# Patient Record
Sex: Male | Born: 1965 | Race: White | Hispanic: No | Marital: Married | Smoking: Never smoker
Health system: Southern US, Community
[De-identification: ages and names within clinical notes are randomized; demographics above are authoritative.]

## PROBLEM LIST (undated history)

## (undated) DIAGNOSIS — R0981 Nasal congestion: Secondary | ICD-10-CM

---

## 2003-08-21 ENCOUNTER — Emergency Department (HOSPITAL_COMMUNITY): Admission: EM | Admit: 2003-08-21 | Discharge: 2003-08-21 | Payer: Self-pay | Admitting: Emergency Medicine

## 2013-11-30 ENCOUNTER — Encounter (HOSPITAL_COMMUNITY): Payer: PRIVATE HEALTH INSURANCE | Admitting: Anesthesiology

## 2013-11-30 ENCOUNTER — Encounter (HOSPITAL_COMMUNITY): Admission: EM | Disposition: A | Discharge status: Home / Self Care | Payer: Self-pay | Source: Home / Self Care

## 2013-11-30 ENCOUNTER — Inpatient Hospital Stay (HOSPITAL_COMMUNITY)
Admission: EM | Admit: 2013-11-30 | Discharge: 2013-12-03 | DRG: 339 | Disposition: A | Discharge status: Home / Self Care | Payer: PRIVATE HEALTH INSURANCE | Attending: Surgery | Admitting: Surgery

## 2013-11-30 ENCOUNTER — Emergency Department (HOSPITAL_COMMUNITY): Payer: PRIVATE HEALTH INSURANCE | Admitting: Anesthesiology

## 2013-11-30 ENCOUNTER — Emergency Department (HOSPITAL_COMMUNITY): Payer: PRIVATE HEALTH INSURANCE

## 2013-11-30 ENCOUNTER — Encounter (HOSPITAL_COMMUNITY): Payer: Self-pay | Admitting: Emergency Medicine

## 2013-11-30 DIAGNOSIS — Y836 Removal of other organ (partial) (total) as the cause of abnormal reaction of the patient, or of later complication, without mention of misadventure at the time of the procedure: Secondary | ICD-10-CM | POA: Diagnosis not present

## 2013-11-30 DIAGNOSIS — K3532 Acute appendicitis with perforation and localized peritonitis, without abscess: Secondary | ICD-10-CM | POA: Diagnosis present

## 2013-11-30 DIAGNOSIS — K560 Paralytic ileus: Secondary | ICD-10-CM | POA: Diagnosis not present

## 2013-11-30 DIAGNOSIS — F17200 Nicotine dependence, unspecified, uncomplicated: Secondary | ICD-10-CM | POA: Diagnosis present

## 2013-11-30 DIAGNOSIS — K37 Unspecified appendicitis: Secondary | ICD-10-CM

## 2013-11-30 DIAGNOSIS — K3533 Acute appendicitis with perforation and localized peritonitis, with abscess: Principal | ICD-10-CM | POA: Diagnosis present

## 2013-11-30 DIAGNOSIS — K929 Disease of digestive system, unspecified: Secondary | ICD-10-CM | POA: Diagnosis not present

## 2013-11-30 DIAGNOSIS — Y9210 Unspecified residential institution as the place of occurrence of the external cause: Secondary | ICD-10-CM | POA: Diagnosis not present

## 2013-11-30 DIAGNOSIS — R319 Hematuria, unspecified: Secondary | ICD-10-CM | POA: Diagnosis not present

## 2013-11-30 DIAGNOSIS — K3580 Unspecified acute appendicitis: Secondary | ICD-10-CM

## 2013-11-30 HISTORY — PX: LAPAROSCOPIC APPENDECTOMY: SHX408

## 2013-11-30 HISTORY — PX: APPENDECTOMY: SHX54

## 2013-11-30 HISTORY — DX: Nasal congestion: R09.81

## 2013-11-30 LAB — CBC WITH DIFFERENTIAL/PLATELET
Basophils Absolute: 0 10*3/uL (ref 0.0–0.1)
Basophils Relative: 0 % (ref 0–1)
Eosinophils Absolute: 0 10*3/uL (ref 0.0–0.7)
Eosinophils Relative: 0 % (ref 0–5)
HCT: 43.5 % (ref 39.0–52.0)
Hemoglobin: 15.5 g/dL (ref 13.0–17.0)
Lymphocytes Relative: 7 % — ABNORMAL LOW (ref 12–46)
Lymphs Abs: 1.1 10*3/uL (ref 0.7–4.0)
MCH: 33.5 pg (ref 26.0–34.0)
MCHC: 35.6 g/dL (ref 30.0–36.0)
MCV: 94 fL (ref 78.0–100.0)
Monocytes Absolute: 1.4 10*3/uL — ABNORMAL HIGH (ref 0.1–1.0)
Monocytes Relative: 9 % (ref 3–12)
Neutro Abs: 13.2 10*3/uL — ABNORMAL HIGH (ref 1.7–7.7)
Neutrophils Relative %: 84 % — ABNORMAL HIGH (ref 43–77)
Platelets: 184 10*3/uL (ref 150–400)
RBC: 4.63 MIL/uL (ref 4.22–5.81)
RDW: 13.1 % (ref 11.5–15.5)
WBC: 15.6 10*3/uL — ABNORMAL HIGH (ref 4.0–10.5)

## 2013-11-30 LAB — COMPREHENSIVE METABOLIC PANEL
ALT: 94 U/L — ABNORMAL HIGH (ref 0–53)
AST: 65 U/L — ABNORMAL HIGH (ref 0–37)
Albumin: 3.8 g/dL (ref 3.5–5.2)
Alkaline Phosphatase: 74 U/L (ref 39–117)
BUN: 13 mg/dL (ref 6–23)
CO2: 29 mEq/L (ref 19–32)
Calcium: 10 mg/dL (ref 8.4–10.5)
Chloride: 94 mEq/L — ABNORMAL LOW (ref 96–112)
Creatinine, Ser: 1.34 mg/dL (ref 0.50–1.35)
GFR calc Af Amer: 73 mL/min — ABNORMAL LOW (ref >90)
GFR calc non Af Amer: 63 mL/min — ABNORMAL LOW (ref >90)
Glucose, Bld: 119 mg/dL — ABNORMAL HIGH (ref 70–99)
Potassium: 4.5 mEq/L (ref 3.7–5.3)
Sodium: 136 mEq/L — ABNORMAL LOW (ref 137–147)
Total Bilirubin: 3.1 mg/dL — ABNORMAL HIGH (ref 0.3–1.2)
Total Protein: 7.6 g/dL (ref 6.0–8.3)

## 2013-11-30 LAB — URINALYSIS, ROUTINE W REFLEX MICROSCOPIC
Glucose, UA: NEGATIVE mg/dL
Hgb urine dipstick: NEGATIVE
Ketones, ur: 15 mg/dL — AB
Nitrite: POSITIVE — AB
Protein, ur: 100 mg/dL — AB
Specific Gravity, Urine: 1.031 — ABNORMAL HIGH (ref 1.005–1.030)
Urobilinogen, UA: 1 mg/dL (ref 0.0–1.0)
pH: 5.5 (ref 5.0–8.0)

## 2013-11-30 LAB — URINE MICROSCOPIC-ADD ON

## 2013-11-30 SURGERY — APPENDECTOMY, LAPAROSCOPIC
Anesthesia: General | Site: Abdomen

## 2013-11-30 MED ORDER — SODIUM CHLORIDE 0.9 % IV SOLN
1.0000 g | INTRAVENOUS | Status: DC
  Administered 2013-11-30: 1 g via INTRAVENOUS
  Filled 2013-11-30: qty 1

## 2013-11-30 MED ORDER — IOHEXOL 300 MG/ML  SOLN
25.0000 mL | Freq: Once | INTRAMUSCULAR | Status: AC | PRN
  Administered 2013-11-30: 25 mL via ORAL

## 2013-11-30 MED ORDER — KETOROLAC TROMETHAMINE 30 MG/ML IJ SOLN
INTRAMUSCULAR | Status: DC | PRN
  Administered 2013-11-30: 30 mg via INTRAVENOUS

## 2013-11-30 MED ORDER — MIDAZOLAM HCL 2 MG/2ML IJ SOLN
INTRAMUSCULAR | Status: AC
  Filled 2013-11-30: qty 2

## 2013-11-30 MED ORDER — PHENYLEPHRINE HCL 10 MG/ML IJ SOLN
INTRAMUSCULAR | Status: DC | PRN
  Administered 2013-11-30: 80 ug via INTRAVENOUS

## 2013-11-30 MED ORDER — KETOROLAC TROMETHAMINE 30 MG/ML IJ SOLN
INTRAMUSCULAR | Status: AC
  Filled 2013-11-30: qty 1

## 2013-11-30 MED ORDER — IBUPROFEN 600 MG PO TABS
600.0000 mg | ORAL_TABLET | Freq: Four times a day (QID) | ORAL | Status: DC | PRN
  Administered 2013-12-02: 600 mg via ORAL
  Filled 2013-11-30: qty 1

## 2013-11-30 MED ORDER — ONDANSETRON HCL 4 MG/2ML IJ SOLN
INTRAMUSCULAR | Status: DC | PRN
  Administered 2013-11-30: 4 mg via INTRAVENOUS

## 2013-11-30 MED ORDER — LIDOCAINE HCL (CARDIAC) 20 MG/ML IV SOLN
INTRAVENOUS | Status: DC | PRN
  Administered 2013-11-30: 80 mg via INTRAVENOUS

## 2013-11-30 MED ORDER — LACTATED RINGERS IV SOLN
INTRAVENOUS | Status: DC | PRN
  Administered 2013-11-30 (×2): via INTRAVENOUS

## 2013-11-30 MED ORDER — ONDANSETRON HCL 4 MG/2ML IJ SOLN
4.0000 mg | Freq: Four times a day (QID) | INTRAMUSCULAR | Status: DC | PRN
  Administered 2013-12-02 (×2): 4 mg via INTRAVENOUS
  Filled 2013-11-30 (×2): qty 2

## 2013-11-30 MED ORDER — PROPOFOL 10 MG/ML IV BOLUS
INTRAVENOUS | Status: AC
  Filled 2013-11-30: qty 20

## 2013-11-30 MED ORDER — ENOXAPARIN SODIUM 40 MG/0.4ML ~~LOC~~ SOLN
40.0000 mg | SUBCUTANEOUS | Status: DC
  Administered 2013-12-01: 40 mg via SUBCUTANEOUS
  Filled 2013-11-30 (×4): qty 0.4

## 2013-11-30 MED ORDER — MIDAZOLAM HCL 2 MG/2ML IJ SOLN
INTRAMUSCULAR | Status: DC | PRN
  Administered 2013-11-30: 2 mg via INTRAVENOUS

## 2013-11-30 MED ORDER — ONDANSETRON HCL 4 MG PO TABS: 4.0000 mg | ORAL_TABLET | Freq: Four times a day (QID) | ORAL | Status: DC | PRN

## 2013-11-30 MED ORDER — HYDROMORPHONE HCL PF 1 MG/ML IJ SOLN
1.0000 mg | INTRAMUSCULAR | Status: DC | PRN
  Administered 2013-12-01: 1 mg via INTRAVENOUS
  Filled 2013-11-30: qty 1

## 2013-11-30 MED ORDER — METOCLOPRAMIDE HCL 5 MG/ML IJ SOLN: 10.0000 mg | Freq: Once | INTRAMUSCULAR | Status: DC | PRN

## 2013-11-30 MED ORDER — SODIUM CHLORIDE 0.9 % IR SOLN
Status: DC | PRN
  Administered 2013-11-30: 1000 mL

## 2013-11-30 MED ORDER — PROPOFOL 10 MG/ML IV BOLUS
INTRAVENOUS | Status: DC | PRN
  Administered 2013-11-30: 200 mg via INTRAVENOUS

## 2013-11-30 MED ORDER — ACETAMINOPHEN 325 MG PO TABS
650.0000 mg | ORAL_TABLET | Freq: Once | ORAL | Status: AC
  Administered 2013-11-30: 650 mg via ORAL
  Filled 2013-11-30: qty 2

## 2013-11-30 MED ORDER — HYDROMORPHONE HCL PF 1 MG/ML IJ SOLN: 0.2500 mg | INTRAMUSCULAR | Status: DC | PRN

## 2013-11-30 MED ORDER — MORPHINE SULFATE 4 MG/ML IJ SOLN
4.0000 mg | Freq: Once | INTRAMUSCULAR | Status: AC
  Administered 2013-11-30: 4 mg via INTRAVENOUS
  Filled 2013-11-30: qty 1

## 2013-11-30 MED ORDER — 0.9 % SODIUM CHLORIDE (POUR BTL) OPTIME
TOPICAL | Status: DC | PRN
  Administered 2013-11-30: 1000 mL

## 2013-11-30 MED ORDER — BUPIVACAINE-EPINEPHRINE (PF) 0.5% -1:200000 IJ SOLN
INTRAMUSCULAR | Status: AC
  Filled 2013-11-30: qty 10

## 2013-11-30 MED ORDER — IOHEXOL 300 MG/ML  SOLN
100.0000 mL | Freq: Once | INTRAMUSCULAR | Status: AC | PRN
  Administered 2013-11-30: 100 mL via INTRAVENOUS

## 2013-11-30 MED ORDER — OXYCODONE-ACETAMINOPHEN 5-325 MG PO TABS
1.0000 | ORAL_TABLET | ORAL | Status: DC | PRN
  Administered 2013-12-01 – 2013-12-03 (×4): 2 via ORAL
  Filled 2013-11-30 (×4): qty 2

## 2013-11-30 MED ORDER — PIPERACILLIN-TAZOBACTAM 3.375 G IVPB
3.3750 g | Freq: Three times a day (TID) | INTRAVENOUS | Status: DC
  Administered 2013-12-01 – 2013-12-03 (×7): 3.375 g via INTRAVENOUS
  Filled 2013-11-30 (×8): qty 50

## 2013-11-30 MED ORDER — SODIUM CHLORIDE 0.9 % IV BOLUS (SEPSIS)
1000.0000 mL | Freq: Once | INTRAVENOUS | Status: AC
  Administered 2013-11-30: 1000 mL via INTRAVENOUS

## 2013-11-30 MED ORDER — FENTANYL CITRATE 0.05 MG/ML IJ SOLN
INTRAMUSCULAR | Status: DC | PRN
  Administered 2013-11-30 (×2): 50 ug via INTRAVENOUS

## 2013-11-30 MED ORDER — OXYCODONE HCL 5 MG PO TABS: 5.0000 mg | ORAL_TABLET | Freq: Once | ORAL | Status: DC | PRN

## 2013-11-30 MED ORDER — FENTANYL CITRATE 0.05 MG/ML IJ SOLN
INTRAMUSCULAR | Status: AC
  Filled 2013-11-30: qty 5

## 2013-11-30 MED ORDER — ONDANSETRON HCL 4 MG/2ML IJ SOLN
4.0000 mg | Freq: Once | INTRAMUSCULAR | Status: AC
  Administered 2013-11-30: 4 mg via INTRAVENOUS
  Filled 2013-11-30: qty 2

## 2013-11-30 MED ORDER — POTASSIUM CHLORIDE IN NACL 20-0.9 MEQ/L-% IV SOLN
INTRAVENOUS | Status: DC
  Administered 2013-12-01 – 2013-12-03 (×3): via INTRAVENOUS
  Filled 2013-11-30 (×5): qty 1000

## 2013-11-30 MED ORDER — BUPIVACAINE-EPINEPHRINE 0.5% -1:200000 IJ SOLN
INTRAMUSCULAR | Status: DC | PRN
  Administered 2013-11-30: 20 mL

## 2013-11-30 MED ORDER — OXYCODONE HCL 5 MG/5ML PO SOLN: 5.0000 mg | Freq: Once | ORAL | Status: DC | PRN

## 2013-11-30 MED ORDER — SUCCINYLCHOLINE CHLORIDE 20 MG/ML IJ SOLN
INTRAMUSCULAR | Status: DC | PRN
  Administered 2013-11-30: 100 mg via INTRAVENOUS

## 2013-11-30 SURGICAL SUPPLY — 50 items
APPLIER CLIP LOGIC TI 5 (MISCELLANEOUS) IMPLANT
APPLIER CLIP ROT 10 11.4 M/L (STAPLE)
BANDAGE ADHESIVE 1X3 (GAUZE/BANDAGES/DRESSINGS) ×3 IMPLANT
BENZOIN TINCTURE PRP APPL 2/3 (GAUZE/BANDAGES/DRESSINGS) ×3 IMPLANT
CANISTER SUCTION 2500CC (MISCELLANEOUS) ×3 IMPLANT
CHLORAPREP W/TINT 26ML (MISCELLANEOUS) ×3 IMPLANT
CLIP APPLIE ROT 10 11.4 M/L (STAPLE) IMPLANT
CLOSURE WOUND 1/2 X4 (GAUZE/BANDAGES/DRESSINGS) ×1
COVER SURGICAL LIGHT HANDLE (MISCELLANEOUS) ×3 IMPLANT
CUTTER LINEAR ENDO 35 ETS (STAPLE) ×3 IMPLANT
CUTTER LINEAR ENDO 35 ETS TH (STAPLE) IMPLANT
DECANTER SPIKE VIAL GLASS SM (MISCELLANEOUS) ×3 IMPLANT
DRAIN CHANNEL 19F RND (DRAIN) ×3 IMPLANT
DRAPE UTILITY 15X26 W/TAPE STR (DRAPE) ×6 IMPLANT
ELECT REM PT RETURN 9FT ADLT (ELECTROSURGICAL) ×3
ELECTRODE REM PT RTRN 9FT ADLT (ELECTROSURGICAL) ×1 IMPLANT
ENDOLOOP SUT PDS II  0 18 (SUTURE)
ENDOLOOP SUT PDS II 0 18 (SUTURE) IMPLANT
EVACUATOR SILICONE 100CC (DRAIN) ×3 IMPLANT
GAUZE SPONGE 2X2 8PLY STRL LF (GAUZE/BANDAGES/DRESSINGS) ×2 IMPLANT
GLOVE BIOGEL PI IND STRL 6.5 (GLOVE) ×2 IMPLANT
GLOVE BIOGEL PI INDICATOR 6.5 (GLOVE) ×4
GLOVE ECLIPSE 6.5 STRL STRAW (GLOVE) ×3 IMPLANT
GLOVE SURG SIGNA 7.5 PF LTX (GLOVE) ×3 IMPLANT
GOWN STRL REUS W/ TWL LRG LVL3 (GOWN DISPOSABLE) ×1 IMPLANT
GOWN STRL REUS W/ TWL XL LVL3 (GOWN DISPOSABLE) ×1 IMPLANT
GOWN STRL REUS W/TWL LRG LVL3 (GOWN DISPOSABLE) ×2
GOWN STRL REUS W/TWL XL LVL3 (GOWN DISPOSABLE) ×2
KIT BASIN OR (CUSTOM PROCEDURE TRAY) ×3 IMPLANT
KIT ROOM TURNOVER OR (KITS) ×3 IMPLANT
NS IRRIG 1000ML POUR BTL (IV SOLUTION) ×3 IMPLANT
PAD ARMBOARD 7.5X6 YLW CONV (MISCELLANEOUS) ×6 IMPLANT
POUCH SPECIMEN RETRIEVAL 10MM (ENDOMECHANICALS) ×3 IMPLANT
RELOAD /EVU35 (ENDOMECHANICALS) IMPLANT
RELOAD CUTTER ETS 35MM STAND (ENDOMECHANICALS) IMPLANT
SCALPEL HARMONIC ACE (MISCELLANEOUS) ×3 IMPLANT
SET IRRIG TUBING LAPAROSCOPIC (IRRIGATION / IRRIGATOR) ×3 IMPLANT
SLEEVE ENDOPATH XCEL 5M (ENDOMECHANICALS) ×3 IMPLANT
SPECIMEN JAR SMALL (MISCELLANEOUS) ×3 IMPLANT
SPONGE GAUZE 2X2 STER 10/PKG (GAUZE/BANDAGES/DRESSINGS) ×4
STRIP CLOSURE SKIN 1/2X4 (GAUZE/BANDAGES/DRESSINGS) ×2 IMPLANT
SUT MON AB 4-0 PC3 18 (SUTURE) ×3 IMPLANT
SUT SILK 2 0 FS (SUTURE) ×3 IMPLANT
TAPE CLOTH SOFT 2X10 (GAUZE/BANDAGES/DRESSINGS) ×3 IMPLANT
TOWEL OR 17X24 6PK STRL BLUE (TOWEL DISPOSABLE) ×3 IMPLANT
TOWEL OR 17X26 10 PK STRL BLUE (TOWEL DISPOSABLE) ×3 IMPLANT
TRAY LAPAROSCOPIC (CUSTOM PROCEDURE TRAY) ×3 IMPLANT
TROCAR XCEL BLUNT TIP 100MML (ENDOMECHANICALS) ×3 IMPLANT
TROCAR XCEL NON-BLD 5MMX100MML (ENDOMECHANICALS) ×3 IMPLANT
WATER STERILE IRR 1000ML POUR (IV SOLUTION) IMPLANT

## 2013-11-30 NOTE — ED Provider Notes (Signed)
CSN: [id number]     Arrival date & time 11/30/13  1424 History   First MD Initiated Contact with Patient 11/30/13 1847     Chief Complaint  Patient presents with  . Abdominal Pain     (Consider location/radiation/quality/duration/timing/severity/associated sxs/prior Treatment) The history is provided by the patient. No language interpreter was used.  Zachary Lamb is a 48 y/o M with no known past medical history presenting to the ED with abdominal pain that started Sunday. Patient reported that the pain is localized to the RLQ and is described as a sharp pain - stated that over the course of the past 2 days reported that the pain is localized to the lower portion of his abdomen described as a constant sharp pain with mild radiation to the right lower portion of the back. Patient reported that he has been feeling nauseous and has been having numerous episodes of emesis. Reported that he has not been able to have a bowel movement or passing of gas within the past couple of days. Wife reported that patient was seen and assessed by his PCP yesterday. Denied chest pain, difficulty breathing, numbness, tingling, dizziness, weakness. PCP [facility]  History reviewed. No pertinent past medical history. History reviewed. No pertinent past surgical history. No family history on file. History  Substance Use Topics  . Smoking status: Current Every Day Smoker  . Smokeless tobacco: Not on file  . Alcohol Use: No    Review of Systems  Constitutional: Positive for fever. Negative for chills.  Respiratory: Negative for chest tightness and shortness of breath.   Cardiovascular: Negative for chest pain.  Gastrointestinal: Positive for nausea, vomiting and abdominal pain. Negative for constipation, blood in stool and anal bleeding.  Genitourinary: Positive for hematuria and flank pain. Negative for decreased urine volume.  Musculoskeletal: Positive for back pain.  Neurological: Negative for dizziness and  weakness.  All other systems reviewed and are negative.      Allergies  Review of patient's allergies indicates no known allergies.  Home Medications  No current outpatient prescriptions on file. BP 108/63  Pulse 125  Temp(Src) 102.9 F (39.4 C) (Oral)  Resp 13  Wt 200 lb (90.719 kg)  SpO2 94% Physical Exam  Nursing note and vitals reviewed. Constitutional: He is oriented to person, place, and time. He appears well-developed and well-nourished. No distress.  HENT:  Head: Normocephalic and atraumatic.  Mouth/Throat: Oropharynx is clear and moist. No oropharyngeal exudate.  Eyes: Conjunctivae and EOM are normal. Pupils are equal, round, and reactive to light. Right eye exhibits no discharge. Left eye exhibits no discharge.  Neck: Normal range of motion. Neck supple. No tracheal deviation present.  Cardiovascular: Regular rhythm and normal heart sounds.  Tachycardia present.  Exam reveals no friction rub.   No murmur heard. Pulmonary/Chest: Effort normal and breath sounds normal. No respiratory distress. He has no wheezes. He has no rales.  Abdominal: Soft. Normal appearance and bowel sounds are normal. There is tenderness in the right lower quadrant, suprapubic area and left lower quadrant. There is guarding and tenderness at McBurney's point. There is no rigidity and negative Murphy's sign.    Discomfort upon palpation to the RLQ, LLQ, and suprapubic region Positive McBurney's point  Musculoskeletal: Normal range of motion.  Lymphadenopathy:    He has no cervical adenopathy.  Neurological: He is alert and oriented to person, place, and time. No cranial nerve deficit. He exhibits normal muscle tone. Coordination normal.  Skin: Skin is warm. He is diaphoretic.  Psychiatric: He has a normal mood and affect. His behavior is normal. Thought content normal.    ED Course  Procedures (including critical care time)  8:34 PM This provider spoke with Dr. Magnus Ivan, general surgery -  discussed case, history, presentation, labs, and imaging. General surgery to see.   Results for orders placed during the hospital encounter of 11/30/13  CBC WITH DIFFERENTIAL      Result Value Ref Range   WBC 15.6 (*) 4.0 - 10.5 K/uL   RBC 4.63  4.22 - 5.81 MIL/uL   Hemoglobin 15.5  13.0 - 17.0 g/dL   HCT 16.1  09.6 - 04.5 %   MCV 94.0  78.0 - 100.0 fL   MCH 33.5  26.0 - 34.0 pg   MCHC 35.6  30.0 - 36.0 g/dL   RDW 40.9  81.1 - 91.4 %   Platelets 184  150 - 400 K/uL   Neutrophils Relative % 84 (*) 43 - 77 %   Neutro Abs 13.2 (*) 1.7 - 7.7 K/uL   Lymphocytes Relative 7 (*) 12 - 46 %   Lymphs Abs 1.1  0.7 - 4.0 K/uL   Monocytes Relative 9  3 - 12 %   Monocytes Absolute 1.4 (*) 0.1 - 1.0 K/uL   Eosinophils Relative 0  0 - 5 %   Eosinophils Absolute 0.0  0.0 - 0.7 K/uL   Basophils Relative 0  0 - 1 %   Basophils Absolute 0.0  0.0 - 0.1 K/uL  COMPREHENSIVE METABOLIC PANEL      Result Value Ref Range   Sodium 136 (*) 137 - 147 mEq/L   Potassium 4.5  3.7 - 5.3 mEq/L   Chloride 94 (*) 96 - 112 mEq/L   CO2 29  19 - 32 mEq/L   Glucose, Bld 119 (*) 70 - 99 mg/dL   BUN 13  6 - 23 mg/dL   Creatinine, Ser 7.82  0.50 - 1.35 mg/dL   Calcium 95.6  8.4 - 21.3 mg/dL   Total Protein 7.6  6.0 - 8.3 g/dL   Albumin 3.8  3.5 - 5.2 g/dL   AST 65 (*) 0 - 37 U/L   ALT 94 (*) 0 - 53 U/L   Alkaline Phosphatase 74  39 - 117 U/L   Total Bilirubin 3.1 (*) 0.3 - 1.2 mg/dL   GFR calc non Af Amer 63 (*) >90 mL/min   GFR calc Af Amer 73 (*) >90 mL/min  URINALYSIS, ROUTINE W REFLEX MICROSCOPIC      Result Value Ref Range   Color, Urine ORANGE (*) YELLOW   APPearance CLOUDY (*) CLEAR   Specific Gravity, Urine 1.031 (*) 1.005 - 1.030   pH 5.5  5.0 - 8.0   Glucose, UA NEGATIVE  NEGATIVE mg/dL   Hgb urine dipstick NEGATIVE  NEGATIVE   Bilirubin Urine MODERATE (*) NEGATIVE   Ketones, ur 15 (*) NEGATIVE mg/dL   Protein, ur 086 (*) NEGATIVE mg/dL   Urobilinogen, UA 1.0  0.0 - 1.0 mg/dL   Nitrite POSITIVE  (*) NEGATIVE   Leukocytes, UA SMALL (*) NEGATIVE  URINE MICROSCOPIC-ADD ON      Result Value Ref Range   Squamous Epithelial / LPF FEW (*) RARE   WBC, UA 3-6  <3 WBC/hpf   RBC / HPF 0-2  <3 RBC/hpf   Bacteria, UA FEW (*) RARE   Casts HYALINE CASTS (*) NEGATIVE   Urine-Other MUCOUS PRESENT      Labs Review Labs Reviewed  CBC  WITH DIFFERENTIAL - Abnormal; Notable for the following:    WBC 15.6 (*)    Neutrophils Relative % 84 (*)    Neutro Abs 13.2 (*)    Lymphocytes Relative 7 (*)    Monocytes Absolute 1.4 (*)    All other components within normal limits  COMPREHENSIVE METABOLIC PANEL - Abnormal; Notable for the following:    Sodium 136 (*)    Chloride 94 (*)    Glucose, Bld 119 (*)    AST 65 (*)    ALT 94 (*)    Total Bilirubin 3.1 (*)    GFR calc non Af Amer 63 (*)    GFR calc Af Amer 73 (*)    All other components within normal limits  URINALYSIS, ROUTINE W REFLEX MICROSCOPIC - Abnormal; Notable for the following:    Color, Urine ORANGE (*)    APPearance CLOUDY (*)    Specific Gravity, Urine 1.031 (*)    Bilirubin Urine MODERATE (*)    Ketones, ur 15 (*)    Protein, ur 100 (*)    Nitrite POSITIVE (*)    Leukocytes, UA SMALL (*)    All other components within normal limits  URINE MICROSCOPIC-ADD ON - Abnormal; Notable for the following:    Squamous Epithelial / LPF FEW (*)    Bacteria, UA FEW (*)    Casts HYALINE CASTS (*)    All other components within normal limits   Imaging Review Ct Abdomen Pelvis W Contrast  11/30/2013   CLINICAL DATA:  Severe right lower quadrant pain. nausea and vomiting. Fever.  EXAM: CT ABDOMEN AND PELVIS WITH CONTRAST  TECHNIQUE: Multidetector CT imaging of the abdomen and pelvis was performed using the standard protocol following bolus administration of intravenous contrast.  CONTRAST:  OMNIPAQUE IOHEXOL 300 MG/ML  SOLN  COMPARISON:  None.  FINDINGS: The appendix is dilated measuring approximately 1.5 cm, with a large appendicolith  near the base and mild to moderate periappendiceal inflammatory changes. These findings are consistent with acute appendicitis. There is no evidence of abscess. No evidence of bowel obstruction or other complication.  The liver, gallbladder, pancreas, spleen, adrenal glands, and kidneys are normal in appearance. No evidence of hydronephrosis. No soft tissue masses or lymphadenopathy identified. No other significant abnormality identified.  IMPRESSION: Positive for acute appendicitis.  No evidence of abscess or other complication.  These results were called by telephone at the time of interpretation on 11/30/2013 at 7:52 PM to Dr. Vanetta Mulders, who verbally acknowledged these results.   Electronically Signed   By: Myles Rosenthal M.D.   On: 11/30/2013 19:57     EKG Interpretation None      MDM   Final diagnoses:  Appendicitis   Medications  sodium chloride 0.9 % bolus 1,000 mL (1,000 mLs Intravenous New Bag/Given 11/30/13 1913)  acetaminophen (TYLENOL) tablet 650 mg (650 mg Oral Given 11/30/13 1911)  iohexol (OMNIPAQUE) 300 MG/ML solution 25 mL (25 mLs Oral Contrast Given 11/30/13 1917)  iohexol (OMNIPAQUE) 300 MG/ML solution 100 mL (100 mLs Intravenous Contrast Given 11/30/13 1934)  morphine 4 MG/ML injection 4 mg (4 mg Intravenous Given 11/30/13 2019)  ondansetron (ZOFRAN) injection 4 mg (4 mg Intravenous Given 11/30/13 2019)   Filed Vitals:   11/30/13 1830 11/30/13 1845 11/30/13 1900 11/30/13 1915  BP: 111/67 109/66 118/70 108/63  Pulse: 130 127 129 125  Temp:      TempSrc:      Resp: 18 21 25 13   Weight:  SpO2: 96% 95% 95% 94%   Patient presenting to the ED with abdominal pain that started on Sunday localized to the RLQ that has now spread to the lower abdominal region bilaterally described as a constant sharp pain. Associated symptoms of nausea and vomiting. Reported that he has been feeling feverish over the past couple of days. Reported that he was seen and assessed by his PCP  yesterday.  Alert and oriented. GCS 15. Heart rate and rhythm normal. Lungs clear to auscultation to upper and lower lobes bilaterally. Radial pulses 2+ bilaterally. BS normoactive in all 4 quadrants with discomfort upon palpation to the RLQ, LLQ, suprapubic with positive guarding. Positive McBurney's point.  CBC noted elevation white blood cell count 15.6 with elevated neutrophils of 13.2--negative left shift identified. CMP noted elevated AST 65 ALT of 94. Urinalysis noted small leukocytes and positive nitrites-with positive urinary tract infection identified. CT abdomen and pelvis identified acute appendicitis-no evidence of abscess or other complication.  This provider spoke with general surgery - general surgeon see and assess patient. Discussed with patient he'll most likely be going into surgery for approval of the appendix. Patient not febrile upon arrival to the ED with a temperature 102F-Tylenol minister in ED setting. Patient tachycardic. Blood pressure controlled while in ED setting. IV fluids administered. Patient to be admitted to the hospital for appendicitis.   Raymon MuttonMarissa Dontavius Keim, PA-C 11/30/13 2347

## 2013-11-30 NOTE — Transfer of Care (Signed)
Immediate Anesthesia Transfer of Care Note  Patient: Zachary Lamb  Procedure(s) Performed: Procedure(s): APPENDECTOMY LAPAROSCOPIC (N/A)  Patient Location: PACU  Anesthesia Type:General  Level of Consciousness: awake, sedated and patient cooperative  Airway & Oxygen Therapy: Patient Spontanous Breathing and Patient connected to nasal cannula oxygen  Post-op Assessment: Report given to PACU RN, Post -op Vital signs reviewed and stable and Patient moving all extremities X 4  Post vital signs: Reviewed  Complications: No apparent anesthesia complications

## 2013-11-30 NOTE — Anesthesia Preprocedure Evaluation (Signed)
Anesthesia Evaluation  Patient identified by MRN, date of birth, ID band Patient awake    Reviewed: Allergy & Precautions, H&P , NPO status , Patient's Chart, lab work & pertinent test results, reviewed documented beta blocker date and time   Airway Mallampati: II TM Distance: >3 FB Neck ROM: full    Dental   Pulmonary Current Smoker,  breath sounds clear to auscultation        Cardiovascular negative cardio ROS  Rhythm:regular     Neuro/Psych negative neurological ROS  negative psych ROS   GI/Hepatic negative GI ROS, Neg liver ROS,   Endo/Other  negative endocrine ROS  Renal/GU negative Renal ROS  negative genitourinary   Musculoskeletal   Abdominal   Peds  Hematology negative hematology ROS (+)   Anesthesia Other Findings See surgeon's H&P   Reproductive/Obstetrics negative OB ROS                           Anesthesia Physical Anesthesia Plan  ASA: II and emergent  Anesthesia Plan: General   Post-op Pain Management:    Induction: Intravenous, Rapid sequence and Cricoid pressure planned  Airway Management Planned: Oral ETT  Additional Equipment:   Intra-op Plan:   Post-operative Plan: Extubation in OR  Informed Consent: I have reviewed the patients History and Physical, chart, labs and discussed the procedure including the risks, benefits and alternatives for the proposed anesthesia with the patient or authorized representative who has indicated his/her understanding and acceptance.   Dental Advisory Given  Plan Discussed with: CRNA and Surgeon  Anesthesia Plan Comments:         Anesthesia Quick Evaluation  

## 2013-11-30 NOTE — Op Note (Signed)
Appendectomy, Lap, Procedure Note  Indications: The patient presented with a history of right-sided abdominal pain. A CT revealed findings consistent with acute appendicitis.  Pre-operative Diagnosis: Acute appendicitis with generalized peritonitis  Post-operative Diagnosis: Acute appendicitis with peritoneal abscess  Surgeon: Abigail MiyamotoBLACKMAN,Labib Cwynar A   Assistants: 0  Anesthesia: General endotracheal anesthesia  ASA Class: 1  Procedure Details  The patient was seen again in the Holding Room. The risks, benefits, complications, treatment options, and expected outcomes were discussed with the patient and/or family. The possibilities of reaction to medication, perforation of viscus, bleeding, recurrent infection, finding a normal appendix, the need for additional procedures, failure to diagnose a condition, and creating a complication requiring transfusion or operation were discussed. There was concurrence with the proposed plan and informed consent was obtained. The site of surgery was properly noted. The patient was taken to Operating Room, identified as Zachary Lamb and the procedure verified as Appendectomy. A Time Out was held and the above information confirmed.  The patient was placed in the supine position and general anesthesia was induced, along with placement of orogastric tube, Venodyne boots, and a Foley catheter. The abdomen was prepped and draped in a sterile fashion. A one centimeter infraumbilical incision was made.  The umbilical stalk was elevated, and the midline fascia was incised with a #11 blade.  A Kelly clamp was used to confirm entrance into the peritoneal cavity.  A pursestring suture was passed around the incision with a 0 Vicryl.  The Hasson was introduced into the abdomen and the tails of the suture were used to hold the Hasson in place.   The pneumoperitoneum was then established to steady pressure of 15 mmHg.  Additional 5 mm cannulas then placed in the left lower  quadrant of the abdomen and the suprapubic region under direct visualization. A careful evaluation of the entire abdomen was carried out. The patient was placed in Trendelenburg and left lateral decubitus position. The small intestines were retracted in the cephalad and left lateral direction away from the pelvis and right lower quadrant. The patient was found to have an enlarged and inflamed appendix that was extending into the pelvis. There was purulent fluid, fibrinous exudate, and a periappendiceal abcess.  The appendix was carefully dissected. The appendix was was skeletonized with the harmonic scalpel.   The appendix was divided at its base using an endo-GIA stapler. Minimal appendiceal stump was left in place. There was no evidence of bleeding, leakage, or complication after division of the appendix. Irrigation was also performed and irrigate suctioned from the abdomen as well.  A small incision was made in the RLQ and a 19 FR blake drain was placed in the right pericolic gutter.  It was sutured in place with a 2.0 silk suture.  All ports were then removed under direct vision.  The umbilical port site was closed with the purse string suture. There was no residual palpable fascial defect.  The trocar site skin wounds were closed with 4-0 Monocryl.  Instrument, sponge, and needle counts were correct at the conclusion of the case.   Findings: The appendix was found to be inflamed. There were signs of necrosis.  There was perforation. There was abscess formation.  Estimated Blood Loss:  Minimal         Drains:19 fr blake         Complications:  None; patient tolerated the procedure well.         Disposition: PACU - hemodynamically stable.  Condition: stable

## 2013-11-30 NOTE — ED Notes (Signed)
Marissa PA and Dr. Deretha EmoryZackowski at bedside

## 2013-11-30 NOTE — Anesthesia Procedure Notes (Signed)
Procedure Name: Intubation Date/Time: 11/30/2013 9:42 PM Performed by: Molli HazardGORDON, Micharl Helmes M Pre-anesthesia Checklist: Patient identified, Emergency Drugs available, Suction available, Patient being monitored and Timeout performed Patient Re-evaluated:Patient Re-evaluated prior to inductionOxygen Delivery Method: Circle system utilized Preoxygenation: Pre-oxygenation with 100% oxygen Intubation Type: IV induction and Rapid sequence Laryngoscope Size: Miller and 2 Grade View: Grade I Tube type: Oral Tube size: 7.5 mm Number of attempts: 1 Airway Equipment and Method: Stylet Placement Confirmation: ETT inserted through vocal cords under direct vision,  positive ETCO2 and breath sounds checked- equal and bilateral Secured at: 23 cm Tube secured with: Tape Dental Injury: Teeth and Oropharynx as per pre-operative assessment

## 2013-11-30 NOTE — ED Provider Notes (Signed)
Medical screening examination/treatment/procedure(s) were conducted as a shared visit with non-physician practitioner(s) and myself.  I personally evaluated the patient during the encounter.   EKG Interpretation None       Results for orders placed during the hospital encounter of 11/30/13  CBC WITH DIFFERENTIAL      Result Value Ref Range   WBC 15.6 (*) 4.0 - 10.5 K/uL   RBC 4.63  4.22 - 5.81 MIL/uL   Hemoglobin 15.5  13.0 - 17.0 g/dL   HCT 04.543.5  40.939.0 - 81.152.0 %   MCV 94.0  78.0 - 100.0 fL   MCH 33.5  26.0 - 34.0 pg   MCHC 35.6  30.0 - 36.0 g/dL   RDW 91.413.1  78.211.5 - 95.615.5 %   Platelets 184  150 - 400 K/uL   Neutrophils Relative % 84 (*) 43 - 77 %   Neutro Abs 13.2 (*) 1.7 - 7.7 K/uL   Lymphocytes Relative 7 (*) 12 - 46 %   Lymphs Abs 1.1  0.7 - 4.0 K/uL   Monocytes Relative 9  3 - 12 %   Monocytes Absolute 1.4 (*) 0.1 - 1.0 K/uL   Eosinophils Relative 0  0 - 5 %   Eosinophils Absolute 0.0  0.0 - 0.7 K/uL   Basophils Relative 0  0 - 1 %   Basophils Absolute 0.0  0.0 - 0.1 K/uL  COMPREHENSIVE METABOLIC PANEL      Result Value Ref Range   Sodium 136 (*) 137 - 147 mEq/L   Potassium 4.5  3.7 - 5.3 mEq/L   Chloride 94 (*) 96 - 112 mEq/L   CO2 29  19 - 32 mEq/L   Glucose, Bld 119 (*) 70 - 99 mg/dL   BUN 13  6 - 23 mg/dL   Creatinine, Ser 2.131.34  0.50 - 1.35 mg/dL   Calcium 08.610.0  8.4 - 57.810.5 mg/dL   Total Protein 7.6  6.0 - 8.3 g/dL   Albumin 3.8  3.5 - 5.2 g/dL   AST 65 (*) 0 - 37 U/L   ALT 94 (*) 0 - 53 U/L   Alkaline Phosphatase 74  39 - 117 U/L   Total Bilirubin 3.1 (*) 0.3 - 1.2 mg/dL   GFR calc non Af Amer 63 (*) >90 mL/min   GFR calc Af Amer 73 (*) >90 mL/min  URINALYSIS, ROUTINE W REFLEX MICROSCOPIC      Result Value Ref Range   Color, Urine ORANGE (*) YELLOW   APPearance CLOUDY (*) CLEAR   Specific Gravity, Urine 1.031 (*) 1.005 - 1.030   pH 5.5  5.0 - 8.0   Glucose, UA NEGATIVE  NEGATIVE mg/dL   Hgb urine dipstick NEGATIVE  NEGATIVE   Bilirubin Urine MODERATE (*)  NEGATIVE   Ketones, ur 15 (*) NEGATIVE mg/dL   Protein, ur 469100 (*) NEGATIVE mg/dL   Urobilinogen, UA 1.0  0.0 - 1.0 mg/dL   Nitrite POSITIVE (*) NEGATIVE   Leukocytes, UA SMALL (*) NEGATIVE  URINE MICROSCOPIC-ADD ON      Result Value Ref Range   Squamous Epithelial / LPF FEW (*) RARE   WBC, UA 3-6  <3 WBC/hpf   RBC / HPF 0-2  <3 RBC/hpf   Bacteria, UA FEW (*) RARE   Casts HYALINE CASTS (*) NEGATIVE   Urine-Other MUCOUS PRESENT     Ct Abdomen Pelvis W Contrast  11/30/2013   CLINICAL DATA:  Severe right lower quadrant pain. nausea and vomiting. Fever.  EXAM: CT ABDOMEN  AND PELVIS WITH CONTRAST  TECHNIQUE: Multidetector CT imaging of the abdomen and pelvis was performed using the standard protocol following bolus administration of intravenous contrast.  CONTRAST:  OMNIPAQUE IOHEXOL 300 MG/ML  SOLN  COMPARISON:  None.  FINDINGS: The appendix is dilated measuring approximately 1.5 cm, with a large appendicolith near the base and mild to moderate periappendiceal inflammatory changes. These findings are consistent with acute appendicitis. There is no evidence of abscess. No evidence of bowel obstruction or other complication.  The liver, gallbladder, pancreas, spleen, adrenal glands, and kidneys are normal in appearance. No evidence of hydronephrosis. No soft tissue masses or lymphadenopathy identified. No other significant abnormality identified.  IMPRESSION: Positive for acute appendicitis.  No evidence of abscess or other complication.  These results were called by telephone at the time of interpretation on 11/30/2013 at 7:52 PM to Dr. Vanetta Mulders, who verbally acknowledged these results.   Electronically Signed   By: Myles Rosenthal M.D.   On: 11/30/2013 19:57    The patient seen by me. Patient with onset of symptoms on Sunday evening right lower corner pain and tenderness CAT scan consistent with acute appendicitis. General surgery contacted. Patient last had something to eat around 3:00 in the  afternoon he had a couple sips of water to get some Tylenol down recently.  Shelda Jakes, MD 11/30/13 2016

## 2013-11-30 NOTE — ED Notes (Signed)
Pt started having vomiting and lower right cramping and then saw MD and was told to call if worse.  Pt states pain is spreading across abdomen.  Pt reports not able to have BM for 2 days and cannot pass gas.  Pt states went to urinate and had blood in his urine.  Pt reports pain to lower back.

## 2013-11-30 NOTE — ED Notes (Signed)
Pt finished drinking contrast. CT notified. 

## 2013-11-30 NOTE — H&P (Signed)
Zachary Lamb is an 48 y.o. male.   Chief Complaint: right lower quadrant abdominal pain HPI: this gentleman presents with a two-day history of right lower quadrant abdominal pain. It started vaguely on Sunday. He has had nausea and vomiting. The pain is now sharp and constant located in the right lower quadrant. It is moderate to severe in nature. It does not refer anywhere else. He is otherwise without complaints  History reviewed. No pertinent past medical history.  History reviewed. No pertinent past surgical history.  No family history on file. Social History:  reports that he has been smoking.  He does not have any smokeless tobacco history on file. He reports that he does not drink alcohol or use illicit drugs.  Allergies: No Known Allergies   (Not in a hospital admission)  Results for orders placed during the hospital encounter of 11/30/13 (from the past 48 hour(s))  CBC WITH DIFFERENTIAL     Status: Abnormal   Collection Time    11/30/13  2:40 PM      Result Value Ref Range   WBC 15.6 (*) 4.0 - 10.5 K/uL   RBC 4.63  4.22 - 5.81 MIL/uL   Hemoglobin 15.5  13.0 - 17.0 g/dL   HCT 43.5  39.0 - 52.0 %   MCV 94.0  78.0 - 100.0 fL   MCH 33.5  26.0 - 34.0 pg   MCHC 35.6  30.0 - 36.0 g/dL   RDW 13.1  11.5 - 15.5 %   Platelets 184  150 - 400 K/uL   Neutrophils Relative % 84 (*) 43 - 77 %   Neutro Abs 13.2 (*) 1.7 - 7.7 K/uL   Lymphocytes Relative 7 (*) 12 - 46 %   Lymphs Abs 1.1  0.7 - 4.0 K/uL   Monocytes Relative 9  3 - 12 %   Monocytes Absolute 1.4 (*) 0.1 - 1.0 K/uL   Eosinophils Relative 0  0 - 5 %   Eosinophils Absolute 0.0  0.0 - 0.7 K/uL   Basophils Relative 0  0 - 1 %   Basophils Absolute 0.0  0.0 - 0.1 K/uL  COMPREHENSIVE METABOLIC PANEL     Status: Abnormal   Collection Time    11/30/13  2:40 PM      Result Value Ref Range   Sodium 136 (*) 137 - 147 mEq/L   Potassium 4.5  3.7 - 5.3 mEq/L   Chloride 94 (*) 96 - 112 mEq/L   CO2 29  19 - 32 mEq/L   Glucose, Bld  119 (*) 70 - 99 mg/dL   BUN 13  6 - 23 mg/dL   Creatinine, Ser 1.34  0.50 - 1.35 mg/dL   Calcium 10.0  8.4 - 10.5 mg/dL   Total Protein 7.6  6.0 - 8.3 g/dL   Albumin 3.8  3.5 - 5.2 g/dL   AST 65 (*) 0 - 37 U/L   ALT 94 (*) 0 - 53 U/L   Alkaline Phosphatase 74  39 - 117 U/L   Total Bilirubin 3.1 (*) 0.3 - 1.2 mg/dL   GFR calc non Af Amer 63 (*) >90 mL/min   GFR calc Af Amer 73 (*) >90 mL/min   Comment: (NOTE)     The eGFR has been calculated using the CKD EPI equation.     This calculation has not been validated in all clinical situations.     eGFR's persistently <90 mL/min signify possible Chronic Kidney     Disease.  URINALYSIS,  ROUTINE W REFLEX MICROSCOPIC     Status: Abnormal   Collection Time    11/30/13  6:47 PM      Result Value Ref Range   Color, Urine ORANGE (*) YELLOW   Comment: BIOCHEMICALS MAY BE AFFECTED BY COLOR   APPearance CLOUDY (*) CLEAR   Specific Gravity, Urine 1.031 (*) 1.005 - 1.030   pH 5.5  5.0 - 8.0   Glucose, UA NEGATIVE  NEGATIVE mg/dL   Hgb urine dipstick NEGATIVE  NEGATIVE   Bilirubin Urine MODERATE (*) NEGATIVE   Ketones, ur 15 (*) NEGATIVE mg/dL   Protein, ur 100 (*) NEGATIVE mg/dL   Urobilinogen, UA 1.0  0.0 - 1.0 mg/dL   Nitrite POSITIVE (*) NEGATIVE   Leukocytes, UA SMALL (*) NEGATIVE  URINE MICROSCOPIC-ADD ON     Status: Abnormal   Collection Time    11/30/13  6:47 PM      Result Value Ref Range   Squamous Epithelial / LPF FEW (*) RARE   WBC, UA 3-6  <3 WBC/hpf   RBC / HPF 0-2  <3 RBC/hpf   Bacteria, UA FEW (*) RARE   Casts HYALINE CASTS (*) NEGATIVE   Urine-Other MUCOUS PRESENT     Comment: AMORPHOUS URATES/PHOSPHATES   Ct Abdomen Pelvis W Contrast  11/30/2013   CLINICAL DATA:  Severe right lower quadrant pain. nausea and vomiting. Fever.  EXAM: CT ABDOMEN AND PELVIS WITH CONTRAST  TECHNIQUE: Multidetector CT imaging of the abdomen and pelvis was performed using the standard protocol following bolus administration of intravenous  contrast.  CONTRAST:  170m OMNIPAQUE IOHEXOL 300 MG/ML  SOLN  COMPARISON:  None.  FINDINGS: The appendix is dilated measuring approximately 1.5 cm, with a large appendicolith near the base and mild to moderate periappendiceal inflammatory changes. These findings are consistent with acute appendicitis. There is no evidence of abscess. No evidence of bowel obstruction or other complication.  The liver, gallbladder, pancreas, spleen, adrenal glands, and kidneys are normal in appearance. No evidence of hydronephrosis. No soft tissue masses or lymphadenopathy identified. No other significant abnormality identified.  IMPRESSION: Positive for acute appendicitis.  No evidence of abscess or other complication.  These results were called by telephone at the time of interpretation on 11/30/2013 at 7:52 PM to Dr. SFredia Sorrow who verbally acknowledged these results.   Electronically Signed   By: JEarle GellM.D.   On: 11/30/2013 19:57    Review of Systems  All other systems reviewed and are negative.    Blood pressure 108/63, pulse 125, temperature 102.9 F (39.4 C), temperature source Oral, resp. rate 13, weight 200 lb (90.719 kg), SpO2 94.00%. Physical Exam  Constitutional: He is oriented to person, place, and time. He appears well-developed and well-nourished. He appears distressed.  HENT:  Head: Normocephalic and atraumatic.  Right Ear: External ear normal.  Left Ear: External ear normal.  Nose: Nose normal.  Mouth/Throat: No oropharyngeal exudate.  Eyes: Conjunctivae are normal. Pupils are equal, round, and reactive to light. Right eye exhibits no discharge. Left eye exhibits no discharge. No scleral icterus.  Neck: Normal range of motion. Neck supple. No tracheal deviation present.  Cardiovascular: Regular rhythm and intact distal pulses.   Murmur heard. tachycardic  Respiratory: Effort normal and breath sounds normal. No respiratory distress. He has no wheezes.  GI: Soft. There is tenderness.  There is guarding.  There is moderate to severe tenderness with guarding in the right lower quadrant  Musculoskeletal: Normal range of motion. He exhibits no  edema and no tenderness.  Lymphadenopathy:    He has no cervical adenopathy.  Neurological: He is alert and oriented to person, place, and time.  Skin: Skin is warm and dry. No rash noted. No erythema.  Psychiatric: His behavior is normal. Judgment normal.     Assessment/Plan Acute appendicitis  I discussed this with the patient and his family. I recommend going ahead and proceeding with appendectomy. I discussed the laparoscopic approach.  I discussed the risk of surgery which includes but is not limited to bleeding, infection, injury to surrounding structures, need for further surgery, need to convert to an open procedure, et Ronney Asters. I also discussed post operative recovery. Surgery will be scheduled. Preoperative antibiotics will be given  Wyatte Dames A 11/30/2013, 8:51 PM

## 2013-12-01 ENCOUNTER — Encounter (HOSPITAL_COMMUNITY): Payer: Self-pay | Admitting: *Deleted

## 2013-12-01 LAB — BASIC METABOLIC PANEL
BUN: 13 mg/dL (ref 6–23)
CO2: 25 mEq/L (ref 19–32)
CREATININE: 1.09 mg/dL (ref 0.50–1.35)
Calcium: 8.9 mg/dL (ref 8.4–10.5)
Chloride: 99 mEq/L (ref 96–112)
GFR calc Af Amer: >90 mL/min (ref >90)
GFR, EST NON AFRICAN AMERICAN: 80 mL/min — AB (ref >90)
GLUCOSE: 100 mg/dL — AB (ref 70–99)
Potassium: 5 mEq/L (ref 3.7–5.3)
Sodium: 137 mEq/L (ref 137–147)

## 2013-12-01 LAB — CBC
HCT: 37.4 % — ABNORMAL LOW (ref 39.0–52.0)
HEMOGLOBIN: 13.2 g/dL (ref 13.0–17.0)
MCH: 33.4 pg (ref 26.0–34.0)
MCHC: 35.3 g/dL (ref 30.0–36.0)
MCV: 94.7 fL (ref 78.0–100.0)
Platelets: 169 10*3/uL (ref 150–400)
RBC: 3.95 MIL/uL — ABNORMAL LOW (ref 4.22–5.81)
RDW: 13.1 % (ref 11.5–15.5)
WBC: 8.9 10*3/uL (ref 4.0–10.5)

## 2013-12-01 MED ORDER — BISACODYL 10 MG RE SUPP
10.0000 mg | Freq: Once | RECTAL | Status: AC
  Administered 2013-12-01: 10 mg via RECTAL
  Filled 2013-12-01: qty 1

## 2013-12-01 MED ORDER — DOCUSATE SODIUM 100 MG PO CAPS
100.0000 mg | ORAL_CAPSULE | Freq: Two times a day (BID) | ORAL | Status: DC | PRN
  Administered 2013-12-01 – 2013-12-02 (×3): 100 mg via ORAL
  Filled 2013-12-01 (×3): qty 1

## 2013-12-01 NOTE — Progress Notes (Signed)
UR completed.  Lowell Makara, RN BSN [facility] CCM Trauma/Neuro ICU Case Manager [phone]  

## 2013-12-01 NOTE — Progress Notes (Signed)
1 Day Post-Op  Subjective: Pt feeling quite sore.  RLQ pain improved.  No N/V.  Tolerating clears, hungry.  Afebrile.  Ambulating in the room.  No IS in room yet - ordered one.  Objective: Vital signs in last 24 hours: Temp:  [97.7 F (36.5 C)-102.9 F (39.4 C)] 98.6 F (37 C) (03/18 0500) Pulse Rate:  [94-130] 94 (03/18 0500) Resp:  [9-25] 17 (03/18 0500) BP: (102-137)/(62-77) 102/62 mmHg (03/18 0500) SpO2:  [94 %-100 %] 99 % (03/18 0500) Weight:  [200 lb (90.719 kg)-211 lb 1.6 oz (95.754 kg)] 211 lb 1.6 oz (95.754 kg) (03/17 2356) Last BM Date: 11/28/13  Intake/Output from previous day: 03/17 0701 - 03/18 0700 In: 1500 [I.V.:1500] Out: 610 [Urine:550; Drains:60] Intake/Output this shift:    PE: Gen:  Alert, NAD, pleasant Card:  RRR, no M/G/R heard Pulm:  CTA, no W/R/R Abd: Soft, appropriately tender, ND, +BS, no HSM, incisions C/D/I, drain with serosanguinous drainage (74mL/24 hours)   Lab Results:   Recent Labs  11/30/13 1440 12/01/13 0406  WBC 15.6* 8.9  HGB 15.5 13.2  HCT 43.5 37.4*  PLT 184 169   BMET  Recent Labs  11/30/13 1440 12/01/13 0406  NA 136* 137  K 4.5 5.0  CL 94* 99  CO2 29 25  GLUCOSE 119* 100*  BUN 13 13  CREATININE 1.34 1.09  CALCIUM 10.0 8.9   PT/INR No results found for this basename: LABPROT, INR,  in the last 72 hours CMP     Component Value Date/Time   NA 137 12/01/2013 0406   K 5.0 12/01/2013 0406   CL 99 12/01/2013 0406   CO2 25 12/01/2013 0406   GLUCOSE 100* 12/01/2013 0406   BUN 13 12/01/2013 0406   CREATININE 1.09 12/01/2013 0406   CALCIUM 8.9 12/01/2013 0406   PROT 7.6 11/30/2013 1440   ALBUMIN 3.8 11/30/2013 1440   AST 65* 11/30/2013 1440   ALT 94* 11/30/2013 1440   ALKPHOS 74 11/30/2013 1440   BILITOT 3.1* 11/30/2013 1440   GFRNONAA 80* 12/01/2013 0406   GFRAA >90 12/01/2013 0406   Lipase  No results found for this basename: lipase       Studies/Results: Ct Abdomen Pelvis W Contrast  11/30/2013   CLINICAL DATA:   Severe right lower quadrant pain. nausea and vomiting. Fever.  EXAM: CT ABDOMEN AND PELVIS WITH CONTRAST  TECHNIQUE: Multidetector CT imaging of the abdomen and pelvis was performed using the standard protocol following bolus administration of intravenous contrast.  CONTRAST:  OMNIPAQUE IOHEXOL 300 MG/ML  SOLN  COMPARISON:  None.  FINDINGS: The appendix is dilated measuring approximately 1.5 cm, with a large appendicolith near the base and mild to moderate periappendiceal inflammatory changes. These findings are consistent with acute appendicitis. There is no evidence of abscess. No evidence of bowel obstruction or other complication.  The liver, gallbladder, pancreas, spleen, adrenal glands, and kidneys are normal in appearance. No evidence of hydronephrosis. No soft tissue masses or lymphadenopathy identified. No other significant abnormality identified.  IMPRESSION: Positive for acute appendicitis.  No evidence of abscess or other complication.  These results were called by telephone at the time of interpretation on 11/30/2013 at 7:52 PM to Dr. Vanetta Mulders, who verbally acknowledged these results.   Electronically Signed   By: Myles Rosenthal M.D.   On: 11/30/2013 19:57    Anti-infectives: Anti-infectives   Start     Dose/Rate Route Frequency Ordered Stop   12/01/13 0000  piperacillin-tazobactam (ZOSYN) IVPB 3.375  g     3.375 g 12.5 mL/hr over 240 Minutes Intravenous Every 8 hours 11/30/13 2357     11/30/13 2100  ertapenem (INVANZ) 1 g in sodium chloride 0.9 % 50 mL IVPB  Status:  Discontinued     1 g 100 mL/hr over 30 Minutes Intravenous Every 24 hours 11/30/13 2050 11/30/13 2357       Assessment/Plan Acute appendicitis with peritoneal abscess, POD #1 s/p lap appy and JP drain placement  Plan: 1.  Regular diet 2.  Ambulate and IS 3.  SCD's and lovenox 4.  Continue zosyn for 7 days 5.  Will need to keep here in the hospital for IV antibiotics, pain control, and evaluation of drain,  home in a few days    LOS: 1 day    DORT, Shada Nienaber 12/01/2013, 8:29 AM Pager: [phone]

## 2013-12-01 NOTE — Progress Notes (Signed)
Doing ok will need 10 days of antibiotics.

## 2013-12-01 NOTE — Anesthesia Postprocedure Evaluation (Signed)
Anesthesia Post Note  Patient: Zachary Lamb  Procedure(s) Performed: Procedure(s) (LRB): APPENDECTOMY LAPAROSCOPIC (N/A)  Anesthesia type: General  Patient location: PACU  Post pain: Pain level controlled  Post assessment: Patient's Cardiovascular Status Stable  Last Vitals:  Filed Vitals:   12/01/13 1002  BP: 106/62  Pulse: 92  Temp: 36.8 C  Resp: 18    Post vital signs: Reviewed and stable  Level of consciousness: alert  Complications: No apparent anesthesia complications

## 2013-12-02 LAB — URINALYSIS, ROUTINE W REFLEX MICROSCOPIC
Glucose, UA: NEGATIVE mg/dL
Hgb urine dipstick: NEGATIVE
Ketones, ur: 15 mg/dL — AB
Nitrite: POSITIVE — AB
Protein, ur: 30 mg/dL — AB
Specific Gravity, Urine: 1.025 (ref 1.005–1.030)
UROBILINOGEN UA: 2 mg/dL — AB (ref 0.0–1.0)
pH: 6 (ref 5.0–8.0)

## 2013-12-02 LAB — URINE MICROSCOPIC-ADD ON

## 2013-12-02 MED ORDER — POLYETHYLENE GLYCOL 3350 17 G PO PACK
17.0000 g | PACK | Freq: Every day | ORAL | Status: DC
  Administered 2013-12-02: 17 g via ORAL
  Filled 2013-12-02 (×2): qty 1

## 2013-12-02 MED ORDER — BISACODYL 10 MG RE SUPP: 10.0000 mg | Freq: Every day | RECTAL | Status: DC | PRN

## 2013-12-02 MED ORDER — BISACODYL 10 MG RE SUPP
10.0000 mg | Freq: Once | RECTAL | Status: AC
  Administered 2013-12-02: 10 mg via RECTAL
  Filled 2013-12-02: qty 1

## 2013-12-02 NOTE — Progress Notes (Signed)
Patient ID: Zachary Lamb, male   DOB: 01/15/1966, 48 y.o.   MRN: [id number]  Subjective: No flatus or BM.  No n/v.  Ambulating.  Eating liquids mostly.   Objective:  Vital signs:  Filed Vitals:   12/01/13 1330 12/01/13 1721 12/01/13 2241 12/02/13 0524  BP: 115/57 105/66 115/66 114/65  Pulse: 97 96 96 102  Temp: 97.6 F (36.4 C) 98.3 F (36.8 C) 98.2 F (36.8 C) 98.7 F (37.1 C)  TempSrc: Oral Oral Oral   Resp: _0 Height:      Weight:      SpO2: 97% 98% 92% 94%    Last BM Date: 11/28/13  Intake/Output   Yesterday:  03/18 0701 - 03/19 0700 In: 1800 [P.O.:600; I.V.:1200] Out: 505 [Urine:350; Drains:155] This shift:    I/O last 3 completed shifts: In: 3300 [P.O.:600; I.V.:2700] Out: 51 [Urine:900; Drains:215]    Physical Exam: General: Pt awake/alert/oriented x3 in no  acute distress Chest: cta. No chest wall pain w good excursion CV:  Pulses intact.  Regular rhythm MS: Normal AROM mjr joints.  No obvious deformity Abdomen: Soft. distended. mildly tender at incisions only.  No evidence of peritonitis.  No incarcerated hernias. Ext:  SCDs BLE.  No mjr edema.  No cyanosis Skin: No petechiae / purpura   Problem List:   Active Problems:   Perforated appendicitis    Results:   Labs: Results for orders placed during the hospital encounter of 11/30/13 (from the past 48 hour(s))  CBC WITH DIFFERENTIAL     Status: Abnormal   Collection Time    11/30/13  2:40 PM      Result Value Ref Range   WBC 15.6 (*) 4.0 - 10.5 K/uL   RBC 4.63  4.22 - 5.81 MIL/uL   Hemoglobin 15.5  13.0 - 17.0 g/dL   HCT 43.5  39.0 - 52.0 %   MCV 94.0  78.0 - 100.0 fL   MCH 33.5  26.0 - 34.0 pg   MCHC 35.6  30.0 - 36.0 g/dL   RDW 13.1  11.5 - 15.5 %   Platelets 184  150 - 400 K/uL   Neutrophils Relative % 84 (*) 43 - 77 %   Neutro Abs 13.2 (*) 1.7 - 7.7 K/uL   Lymphocytes Relative 7 (*) 12 - 46 %   Lymphs Abs 1.1  0.7 - 4.0 K/uL   Monocytes Relative 9  3 - 12 %    Monocytes Absolute 1.4 (*) 0.1 - 1.0 K/uL   Eosinophils Relative 0  0 - 5 %   Eosinophils Absolute 0.0  0.0 - 0.7 K/uL   Basophils Relative 0  0 - 1 %   Basophils Absolute 0.0  0.0 - 0.1 K/uL  COMPREHENSIVE METABOLIC PANEL     Status: Abnormal   Collection Time    11/30/13  2:40 PM      Result Value Ref Range   Sodium 136 (*) 137 - 147 mEq/L   Potassium 4.5  3.7 - 5.3 mEq/L   Chloride 94 (*) 96 - 112 mEq/L   CO2 29  19 - 32 mEq/L   Glucose, Bld 119 (*) 70 - 99 mg/dL   BUN 13  6 - 23 mg/dL   Creatinine, Ser 1.34  0.50 - 1.35 mg/dL   Calcium 10.0  8.4 - 10.5 mg/dL   Total Protein 7.6  6.0 - 8.3 g/dL   Albumin 3.8  3.5 - 5.2 g/dL   AST 65 (*)  0 - 37 U/L   ALT 94 (*) 0 - 53 U/L   Alkaline Phosphatase 74  39 - 117 U/L   Total Bilirubin 3.1 (*) 0.3 - 1.2 mg/dL   GFR calc non Af Amer 63 (*) >90 mL/min   GFR calc Af Amer 73 (*) >90 mL/min   Comment: (NOTE)     The eGFR has been calculated using the CKD EPI equation.     This calculation has not been validated in all clinical situations.     eGFR's persistently <90 mL/min signify possible Chronic Kidney     Disease.  URINALYSIS, ROUTINE W REFLEX MICROSCOPIC     Status: Abnormal   Collection Time    11/30/13  6:47 PM      Result Value Ref Range   Color, Urine ORANGE (*) YELLOW   Comment: BIOCHEMICALS MAY BE AFFECTED BY COLOR   APPearance CLOUDY (*) CLEAR   Specific Gravity, Urine 1.031 (*) 1.005 - 1.030   pH 5.5  5.0 - 8.0   Glucose, UA NEGATIVE  NEGATIVE mg/dL   Hgb urine dipstick NEGATIVE  NEGATIVE   Bilirubin Urine MODERATE (*) NEGATIVE   Ketones, ur 15 (*) NEGATIVE mg/dL   Protein, ur 100 (*) NEGATIVE mg/dL   Urobilinogen, UA 1.0  0.0 - 1.0 mg/dL   Nitrite POSITIVE (*) NEGATIVE   Leukocytes, UA SMALL (*) NEGATIVE  URINE MICROSCOPIC-ADD ON     Status: Abnormal   Collection Time    11/30/13  6:47 PM      Result Value Ref Range   Squamous Epithelial / LPF FEW (*) RARE   WBC, UA 3-6  <3 WBC/hpf   RBC / HPF 0-2  <3 RBC/hpf    Bacteria, UA FEW (*) RARE   Casts HYALINE CASTS (*) NEGATIVE   Urine-Other MUCOUS PRESENT     Comment: AMORPHOUS URATES/PHOSPHATES  BASIC METABOLIC PANEL     Status: Abnormal   Collection Time    12/01/13  4:06 AM      Result Value Ref Range   Sodium 137  137 - 147 mEq/L   Potassium 5.0  3.7 - 5.3 mEq/L   Chloride 99  96 - 112 mEq/L   CO2 25  19 - 32 mEq/L   Glucose, Bld 100 (*) 70 - 99 mg/dL   BUN 13  6 - 23 mg/dL   Creatinine, Ser 1.09  0.50 - 1.35 mg/dL   Calcium 8.9  8.4 - 10.5 mg/dL   GFR calc non Af Amer 80 (*) >90 mL/min   GFR calc Af Amer >90  >90 mL/min   Comment: (NOTE)     The eGFR has been calculated using the CKD EPI equation.     This calculation has not been validated in all clinical situations.     eGFR's persistently <90 mL/min signify possible Chronic Kidney     Disease.  CBC     Status: Abnormal   Collection Time    12/01/13  4:06 AM      Result Value Ref Range   WBC 8.9  4.0 - 10.5 K/uL   RBC 3.95 (*) 4.22 - 5.81 MIL/uL   Hemoglobin 13.2  13.0 - 17.0 g/dL   HCT 37.4 (*) 39.0 - 52.0 %   MCV 94.7  78.0 - 100.0 fL   MCH 33.4  26.0 - 34.0 pg   MCHC 35.3  30.0 - 36.0 g/dL   RDW 13.1  11.5 - 15.5 %   Platelets 169  150 -  400 K/uL    Imaging / Studies: Ct Abdomen Pelvis W Contrast  11/30/2013   CLINICAL DATA:  Severe right lower quadrant pain. nausea and vomiting. Fever.  EXAM: CT ABDOMEN AND PELVIS WITH CONTRAST  TECHNIQUE: Multidetector CT imaging of the abdomen and pelvis was performed using the standard protocol following bolus administration of intravenous contrast.  CONTRAST:  159m OMNIPAQUE IOHEXOL 300 MG/ML  SOLN  COMPARISON:  None.  FINDINGS: The appendix is dilated measuring approximately 1.5 cm, with a large appendicolith near the base and mild to moderate periappendiceal inflammatory changes. These findings are consistent with acute appendicitis. There is no evidence of abscess. No evidence of bowel obstruction or other complication.  The liver,  gallbladder, pancreas, spleen, adrenal glands, and kidneys are normal in appearance. No evidence of hydronephrosis. No soft tissue masses or lymphadenopathy identified. No other significant abnormality identified.  IMPRESSION: Positive for acute appendicitis.  No evidence of abscess or other complication.  These results were called by telephone at the time of interpretation on 11/30/2013 at 7:52 PM to Dr. SFredia Sorrow who verbally acknowledged these results.   Electronically Signed   By: JEarle GellM.D.   On: 11/30/2013 19:57    Medications / Allergies: per chart  Antibiotics: Anti-infectives   Start     Dose/Rate Route Frequency Ordered Stop   12/01/13 0000  piperacillin-tazobactam (ZOSYN) IVPB 3.375 g     3.375 g 12.5 mL/hr over 240 Minutes Intravenous Every 8 hours 11/30/13 2357     11/30/13 2100  ertapenem (INVANZ) 1 g in sodium chloride 0.9 % 50 mL IVPB  Status:  Discontinued     1 g 100 mL/hr over 30 Minutes Intravenous Every 24 hours 11/30/13 2050 11/30/13 2357      Assessment/Plan Acute appendicitis with peritoneal abscess, POD #2 s/p lap appy and JP drain placement -may need to back off diet, will hold off for now -add miralax -SCDs, lovenox -IS -Ambulate -Continue with IV antibiotics, total of 7 days of therapy -Continue with drain  EErby Pian AConocoPhillipsSurgery Pager [phone] [phone] 12/02/2013 8:00 AM

## 2013-12-02 NOTE — Progress Notes (Signed)
Ileus not unexpected. Will recheck CBC. KEEP ON LIQUIDS.

## 2013-12-02 NOTE — Progress Notes (Signed)
Urine dark/pink tinged/clear ua sent to lab at this time.

## 2013-12-03 ENCOUNTER — Encounter (HOSPITAL_COMMUNITY): Payer: Self-pay | Admitting: Surgery

## 2013-12-03 LAB — URINE CULTURE
Colony Count: NO GROWTH
Culture: NO GROWTH

## 2013-12-03 LAB — CBC
HEMATOCRIT: 34 % — AB (ref 39.0–52.0)
Hemoglobin: 11.9 g/dL — ABNORMAL LOW (ref 13.0–17.0)
MCH: 33.5 pg (ref 26.0–34.0)
MCHC: 35 g/dL (ref 30.0–36.0)
MCV: 95.8 fL (ref 78.0–100.0)
Platelets: 207 10*3/uL (ref 150–400)
RBC: 3.55 MIL/uL — ABNORMAL LOW (ref 4.22–5.81)
RDW: 13.4 % (ref 11.5–15.5)
WBC: 8 10*3/uL (ref 4.0–10.5)

## 2013-12-03 MED ORDER — CIPROFLOXACIN HCL 500 MG PO TABS: 500.0000 mg | ORAL_TABLET | Freq: Two times a day (BID) | ORAL | Status: DC

## 2013-12-03 MED ORDER — CIPROFLOXACIN HCL 500 MG PO TABS
500.0000 mg | ORAL_TABLET | Freq: Two times a day (BID) | ORAL | Status: DC
  Administered 2013-12-03: 500 mg via ORAL
  Filled 2013-12-03: qty 1

## 2013-12-03 MED ORDER — DSS 100 MG PO CAPS: 100.0000 mg | ORAL_CAPSULE | Freq: Two times a day (BID) | ORAL | Status: DC | PRN

## 2013-12-03 MED ORDER — IBUPROFEN 600 MG PO TABS: 600.0000 mg | ORAL_TABLET | Freq: Four times a day (QID) | ORAL | Status: DC | PRN

## 2013-12-03 MED ORDER — OXYCODONE-ACETAMINOPHEN 5-325 MG PO TABS: 1.0000 | ORAL_TABLET | ORAL | Status: DC | PRN

## 2013-12-03 NOTE — Progress Notes (Signed)
Discharge instructions given and explained to pt. And wife.  Prescriptions given to pt. Pt. And wife verbalized understanding of all orders/instructions.  Pt. In stable condition to return home.  Awaiting ride from wife later in afternoon.   Vanice Sarahhompson, Gaspar Fowle L

## 2013-12-03 NOTE — Discharge Instructions (Signed)

## 2013-12-03 NOTE — Discharge Summary (Signed)
Physician Discharge Summary  Zachary MatesDaniel Lamb HYQ:[id number] DOB: 01/04/1966 DOA: 11/30/2013  PCP: No primary provider on file.  Consultation: none  Admit date: 11/30/2013 Discharge date: 12/03/2013  Recommendations for Outpatient Follow-up:   Follow-up Information   Follow up with [facility] On 12/21/2013. (arrive by 1:15PM for a 1:45PM appt for a post operative check up)    Contact information:   [address]   [place] [address] [phone]      Discharge Diagnoses:  Acute appendicitis with generalized peritonitis  Surgical Procedure: laparoscopic appendectomy with drain placement---Dr. Magnus IvanBlackman 11/30/13  Discharge Condition: stable Disposition: home  Diet recommendation: regular  Filed Weights   11/30/13 1431 11/30/13 2356  Weight: 200 lb (90.719 kg) 211 lb 1.6 oz (95.754 kg)     Filed Vitals:   12/03/13 0538  BP: 112/69  Pulse: 80  Temp: 98.3 F (36.8 C)  Resp: 16     Hospital Course:  [facility] Lamb [facility].  His work up showed acute appendicitis.  He underwent the procedure listed above.  He was found to have peritonitis.  He was therefore kept in the hospital for IV antibiotics.  He developed a post-op ileus which resolved.  His white count normalized.  He had hematuria, antiovitics were changed to cipro for better coverage and a culture is pending.  The hematuria has resolved on POD#3 this was likely related to the surrounding inflammation.  On POD## the patient was tolerating a diet, having BMs, VSS, afebrile, ambulating and therefore felt stable for discharge.  JP drain was removed, serous output.  We discussed warning signs that warrant immediate attention.  Medication risks, benefits and therapeutic alternatives were discussed.  A follow up appointment has been made on his behalf.     Physical Exam:  General: Pt awake/alert/oriented x3 in no acute distress  Chest: cta. No chest wall pain w good  excursion  CV: Pulses intact. Regular rhythm  MS: Normal AROM mjr joints. No obvious deformity  Abdomen: Soft. nondistended. mildly tender at incisions only. No evidence of peritonitis. No incarcerated hernias. JP serous output Ext: SCDs BLE. No mjr edema. No cyanosis  Skin: No petechiae / purpura  Discharge Instructions     Medication List         ciprofloxacin 500 MG tablet  Commonly known as:  CIPRO  Take 1 tablet (500 mg total) by mouth 2 (two) times daily.     DSS 100 MG Caps  Take 100 mg by mouth 2 (two) times daily as needed for mild constipation or moderate constipation.     ibuprofen 600 MG tablet  Commonly known as:  ADVIL,MOTRIN  Take 1 tablet (600 mg total) by mouth every 6 (six) hours as needed (mild pain).     oxyCODONE-acetaminophen 5-325 MG per tablet  Commonly known as:  PERCOCET/ROXICET  Take 1-2 tablets by mouth every 4 (four) hours as needed for moderate pain.           Follow-up Information   Follow up with [facility] On 12/21/2013. (arrive by 1:15PM for a 1:45PM appt for a post operative check up)    Contact information:   [address]   [place] [address] [phone]        The results of significant diagnostics from this hospitalization (including imaging, microbiology, ancillary and laboratory) are listed below for reference.    Significant Diagnostic Studies:  Ct Abdomen Pelvis W Contrast  11/30/2013   CLINICAL DATA:  Severe right lower quadrant pain. nausea and vomiting. Fever.  EXAM: CT ABDOMEN AND PELVIS WITH CONTRAST  TECHNIQUE: Multidetector CT imaging of the abdomen and pelvis was performed using the standard protocol following bolus administration of intravenous contrast.  CONTRAST:  OMNIPAQUE IOHEXOL 300 MG/ML  SOLN  COMPARISON:  None.  FINDINGS: The appendix is dilated measuring approximately 1.5 cm, with a large appendicolith near the base and mild to moderate periappendiceal inflammatory changes.  These findings are consistent with acute appendicitis. There is no evidence of abscess. No evidence of bowel obstruction or other complication.  The liver, gallbladder, pancreas, spleen, adrenal glands, and kidneys are normal in appearance. No evidence of hydronephrosis. No soft tissue masses or lymphadenopathy identified. No other significant abnormality identified.  IMPRESSION: Positive for acute appendicitis.  No evidence of abscess or other complication.  These results were called by telephone at the time of interpretation on 11/30/2013 at 7:52 PM to Dr. Vanetta Mulders, who verbally acknowledged these results.   Electronically Signed   By: Myles Rosenthal M.D.   On: 11/30/2013 19:57    Microbiology: No results found for this or any previous visit (from the past 240 hour(s)).   Labs: Basic Metabolic Panel:  Recent Labs Lab 11/30/13 1440 12/01/13 0406  NA 136* 137  K 4.5 5.0  CL 94* 99  CO2 29 25  GLUCOSE 119* 100*  BUN 13 13  CREATININE 1.34 1.09  CALCIUM 10.0 8.9   Liver Function Tests:  Recent Labs Lab 11/30/13 1440  AST 65*  ALT 94*  ALKPHOS 74  BILITOT 3.1*  PROT 7.6  ALBUMIN 3.8   No results found for this basename: LIPASE, AMYLASE,  in the last 168 hours No results found for this basename: AMMONIA,  in the last 168 hours CBC:  Recent Labs Lab 11/30/13 1440 12/01/13 0406  WBC 15.6* 8.9  NEUTROABS 13.2*  --   HGB 15.5 13.2  HCT 43.5 37.4*  MCV 94.0 94.7  PLT 184 169    Active Problems:   Perforated appendicitis   Signed:  Nasri Boakye, ANP-BC

## 2013-12-03 NOTE — Discharge Summary (Signed)
Feels better. Ok for d/c.

## 2013-12-03 NOTE — Progress Notes (Signed)
JP drain removed.  Pt. Tolerated well.  Oral pain meds given.  Will continue to monitor. Vanice Sarahhompson, Trana Ressler L

## 2013-12-09 ENCOUNTER — Telehealth (INDEPENDENT_AMBULATORY_CARE_PROVIDER_SITE_OTHER): Payer: Self-pay | Admitting: General Surgery

## 2013-12-09 NOTE — Telephone Encounter (Signed)
Pt called to ask for refill of Cipro based on history of ruptured appendix and subsequent surgery.  Pt admits there in no fever, new pain, no swelling, redness, exudate or odor.  Reassured pt the is no indication he needs additional antibiotics at this time, but he wants to ask MD for his opinion.

## 2013-12-10 NOTE — Telephone Encounter (Signed)
Unless he has fever, he does not need to be on any more antibiotics when current ones are finished

## 2013-12-28 ENCOUNTER — Encounter (INDEPENDENT_AMBULATORY_CARE_PROVIDER_SITE_OTHER): Payer: PRIVATE HEALTH INSURANCE

## 2013-12-30 ENCOUNTER — Encounter (INDEPENDENT_AMBULATORY_CARE_PROVIDER_SITE_OTHER): Payer: PRIVATE HEALTH INSURANCE | Admitting: Surgery

## 2014-01-11 ENCOUNTER — Encounter (INDEPENDENT_AMBULATORY_CARE_PROVIDER_SITE_OTHER): Payer: Self-pay

## 2014-01-11 ENCOUNTER — Ambulatory Visit (INDEPENDENT_AMBULATORY_CARE_PROVIDER_SITE_OTHER): Payer: PRIVATE HEALTH INSURANCE | Admitting: General Surgery

## 2014-01-11 VITALS — BP 116/72 | HR 74 | Temp 97.2°F | Resp 14 | Ht 73.25 in | Wt 182.4 lb

## 2014-01-11 DIAGNOSIS — Z9889 Other specified postprocedural states: Secondary | ICD-10-CM

## 2014-01-11 DIAGNOSIS — Z9049 Acquired absence of other specified parts of digestive tract: Secondary | ICD-10-CM

## 2014-01-11 NOTE — Patient Instructions (Signed)
You may resume normal activities.  Follow up as needed. 

## 2014-01-11 NOTE — Progress Notes (Signed)
Kwamane Fitch 1966/06/04 [id number] 01/11/2014   History of Present Illness: Marcelline MatesDaniel Bacci is a  48 y.o. male who presents today status post lap appy by Dr. Magnus IvanBlackman.  Pathology reveals acute suppurative appendicitis.  The patient is tolerating a regular diet, having normal bowel movements, has good pain control.  He is back to most normal activities.  He initially had GI disturbances and took a while for bowel function to return to normal.     Physical Exam: Abd: soft, nontender, active bowel sounds, nondistended.  All incisions are well healed.  Impression: 1.  Acute appendicitis, s/p lap appy  Plan: He is able to return to normal activities. He may follow up on a prn basis.  Frandy Basnett, ANP-BP

## 2019-02-16 DIAGNOSIS — Z1159 Encounter for screening for other viral diseases: Secondary | ICD-10-CM | POA: Diagnosis not present

## 2019-02-16 DIAGNOSIS — H6123 Impacted cerumen, bilateral: Secondary | ICD-10-CM | POA: Diagnosis not present

## 2019-02-25 DIAGNOSIS — M542 Cervicalgia: Secondary | ICD-10-CM | POA: Diagnosis not present

## 2019-02-25 DIAGNOSIS — M9902 Segmental and somatic dysfunction of thoracic region: Secondary | ICD-10-CM | POA: Diagnosis not present

## 2019-02-25 DIAGNOSIS — M9901 Segmental and somatic dysfunction of cervical region: Secondary | ICD-10-CM | POA: Diagnosis not present

## 2019-03-02 DIAGNOSIS — M9901 Segmental and somatic dysfunction of cervical region: Secondary | ICD-10-CM | POA: Diagnosis not present

## 2019-03-02 DIAGNOSIS — M9902 Segmental and somatic dysfunction of thoracic region: Secondary | ICD-10-CM | POA: Diagnosis not present

## 2019-03-02 DIAGNOSIS — M542 Cervicalgia: Secondary | ICD-10-CM | POA: Diagnosis not present

## 2019-04-23 DIAGNOSIS — M25512 Pain in left shoulder: Secondary | ICD-10-CM | POA: Diagnosis not present

## 2019-05-20 ENCOUNTER — Other Ambulatory Visit: Payer: Self-pay | Admitting: Orthopaedic Surgery

## 2019-05-20 DIAGNOSIS — M25512 Pain in left shoulder: Secondary | ICD-10-CM

## 2019-05-20 DIAGNOSIS — G8929 Other chronic pain: Secondary | ICD-10-CM

## 2019-06-01 ENCOUNTER — Other Ambulatory Visit: Payer: Self-pay

## 2019-06-01 ENCOUNTER — Ambulatory Visit
Admission: RE | Admit: 2019-06-01 | Discharge: 2019-06-01 | Disposition: A | Discharge status: Home / Self Care | Payer: BC Managed Care – PPO | Source: Ambulatory Visit | Attending: Orthopaedic Surgery | Admitting: Orthopaedic Surgery

## 2019-06-01 DIAGNOSIS — M75112 Incomplete rotator cuff tear or rupture of left shoulder, not specified as traumatic: Secondary | ICD-10-CM | POA: Diagnosis not present

## 2019-06-01 DIAGNOSIS — G8929 Other chronic pain: Secondary | ICD-10-CM

## 2019-06-07 DIAGNOSIS — M25512 Pain in left shoulder: Secondary | ICD-10-CM | POA: Diagnosis not present

## 2019-07-07 DIAGNOSIS — M25512 Pain in left shoulder: Secondary | ICD-10-CM | POA: Diagnosis not present

## 2019-10-08 DIAGNOSIS — Z0000 Encounter for general adult medical examination without abnormal findings: Secondary | ICD-10-CM | POA: Diagnosis not present

## 2020-01-14 DIAGNOSIS — D1801 Hemangioma of skin and subcutaneous tissue: Secondary | ICD-10-CM | POA: Diagnosis not present

## 2020-01-14 DIAGNOSIS — D485 Neoplasm of uncertain behavior of skin: Secondary | ICD-10-CM | POA: Diagnosis not present

## 2020-01-14 DIAGNOSIS — B351 Tinea unguium: Secondary | ICD-10-CM | POA: Diagnosis not present

## 2020-01-14 DIAGNOSIS — L821 Other seborrheic keratosis: Secondary | ICD-10-CM | POA: Diagnosis not present

## 2020-01-14 DIAGNOSIS — L820 Inflamed seborrheic keratosis: Secondary | ICD-10-CM | POA: Diagnosis not present

## 2020-02-05 DIAGNOSIS — E559 Vitamin D deficiency, unspecified: Secondary | ICD-10-CM | POA: Diagnosis not present

## 2020-02-05 DIAGNOSIS — E785 Hyperlipidemia, unspecified: Secondary | ICD-10-CM | POA: Diagnosis not present

## 2020-02-05 DIAGNOSIS — Z125 Encounter for screening for malignant neoplasm of prostate: Secondary | ICD-10-CM | POA: Diagnosis not present

## 2020-02-05 DIAGNOSIS — Z0000 Encounter for general adult medical examination without abnormal findings: Secondary | ICD-10-CM | POA: Diagnosis not present

## 2020-05-28 ENCOUNTER — Ambulatory Visit: Payer: BC Managed Care – PPO | Attending: Internal Medicine

## 2020-05-28 DIAGNOSIS — Z23 Encounter for immunization: Secondary | ICD-10-CM

## 2020-05-28 NOTE — Progress Notes (Signed)
   Covid-19 Vaccination Clinic  Name:  Zachary Lamb    MRN: [id number] DOB: [birth year]  05/28/2020  Zachary Lamb was observed post Covid-19 immunization for 15 minutes without incident. He was provided with Vaccine Information Sheet and instruction to access the V-Safe system.   Zachary Lamb was instructed to call [phone] with any severe reactions post vaccine: Marland Kitchen Difficulty breathing  . Swelling of face and throat  . A fast heartbeat  . A bad rash all over body  . Dizziness and weakness

## 2020-10-13 IMAGING — MR MR SHOULDER*L* W/O CM
5 series · 36 of 40 positions shown · non-contrast
Comparison: None.

CLINICAL DATA: Left shoulder pain and weakness and decreased range
of motion.

EXAM:
MRI OF THE LEFT SHOULDER WITHOUT CONTRAST
TECHNIQUE: Multiplanar, multisequence MR imaging of the shoulder was performed.
No intravenous contrast was administered.

[Series 3: PD fat-sat · axial · 4.0mm · 0.55mm/px · z∈[-49,+51]mm · 9 of 22 slices shown (1 of 2)]
[im 1/22]
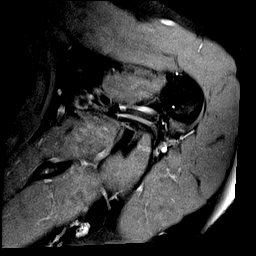
[im 3/22]
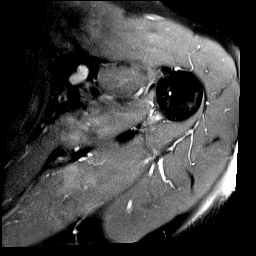
[im 6/22]
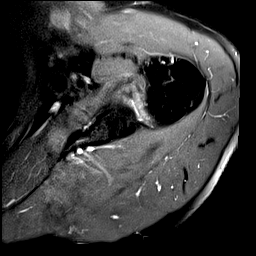
[im 8/22]
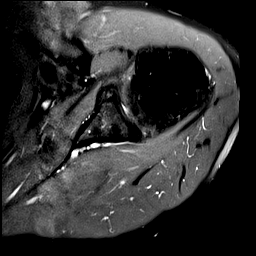
[im 11/22]
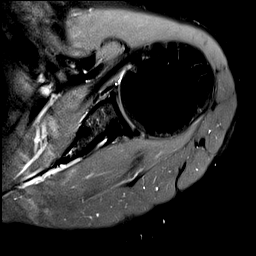
[im 14/22]
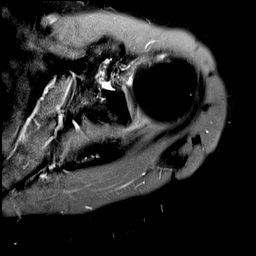
[im 16/22]
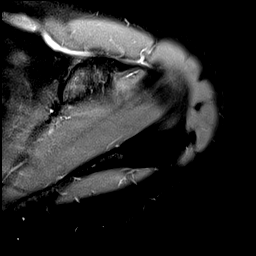
[im 19/22]
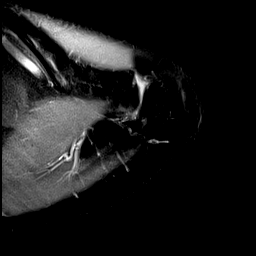
[im 22/22]
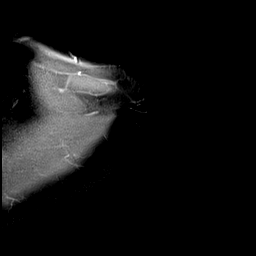

[Series 4: T1 · coronal · 4.0mm · 0.27mm/px · 5 of 22 slices shown]
[im 1/22]
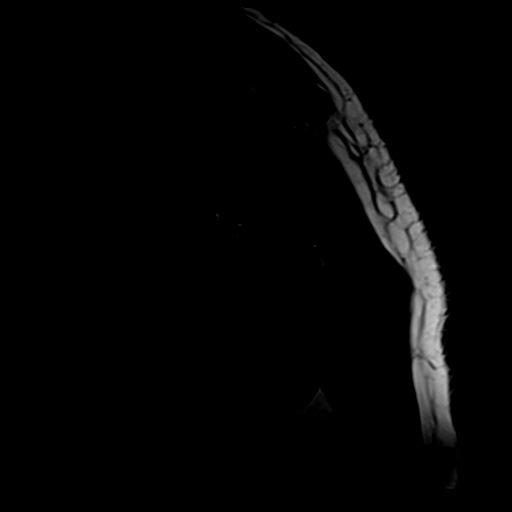
[im 3/22]
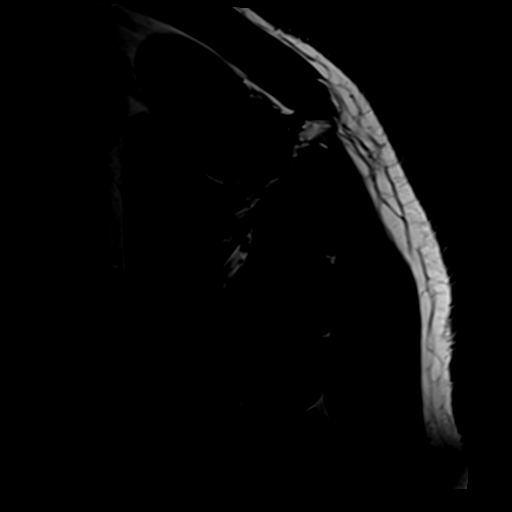
[im 6/22]
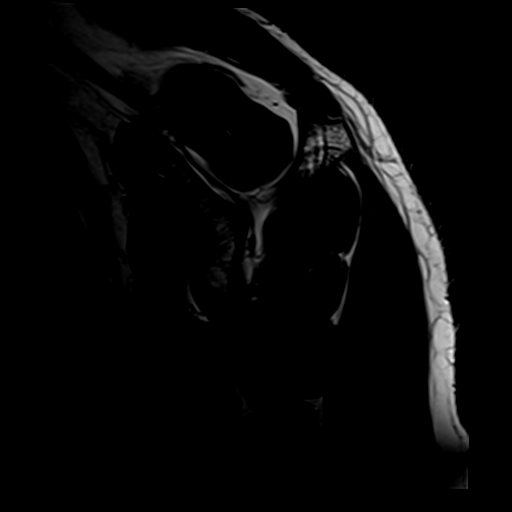
[im 8/22]
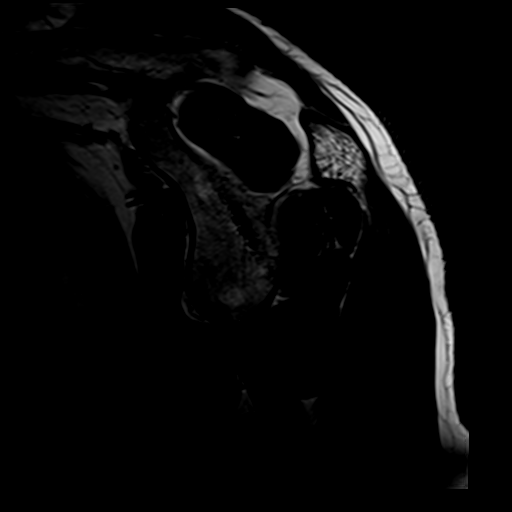
[im 14/22]
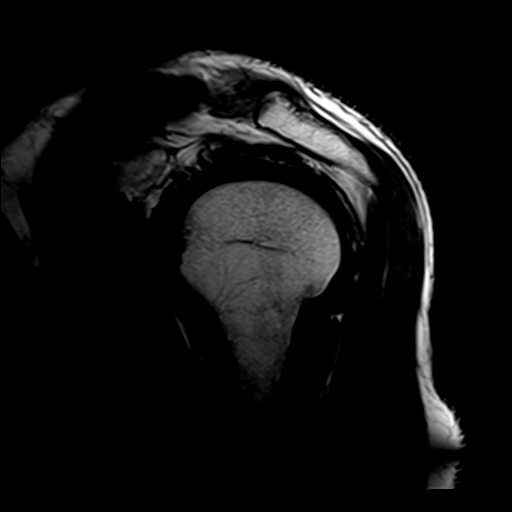

[Series 5: T2 fat-sat · coronal · 4.0mm · 0.55mm/px · 8 of 21 slices shown (1 of 2)]
[im 1/21]
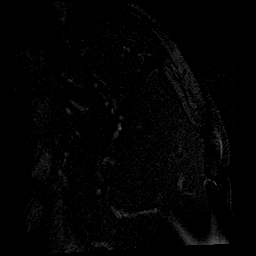
[im 3/21]
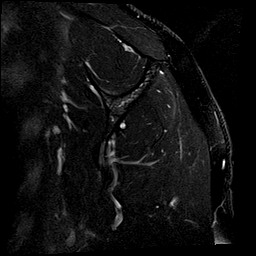
[im 6/21]
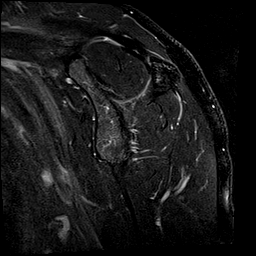
[im 9/21]
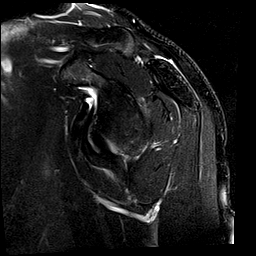
[im 12/21]
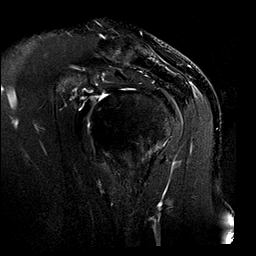
[im 15/21]
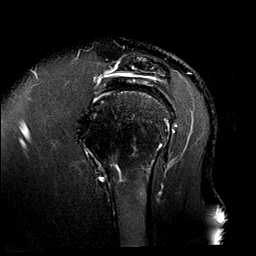
[im 18/21]
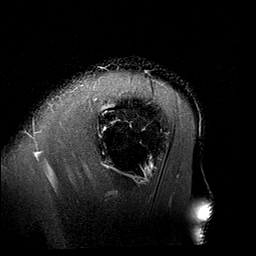
[im 21/21]
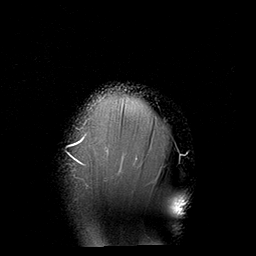

[Series 6: T2 fat-sat · oblique · 4.0mm · 0.55mm/px · 7 of 18 slices shown (2 of 2)]
[im 1/18]
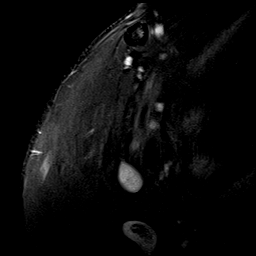
[im 3/18]
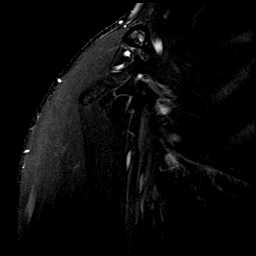
[im 6/18]
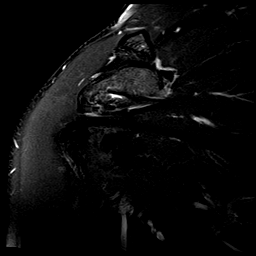
[im 9/18]
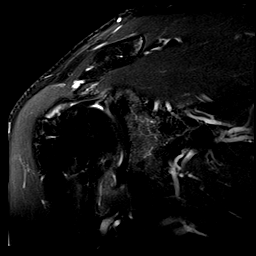
[im 12/18]
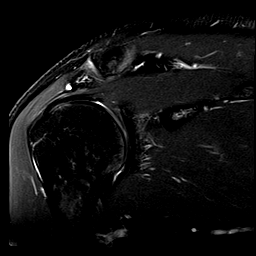
[im 15/18]
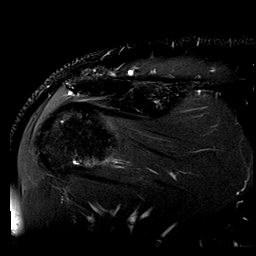
[im 18/18]
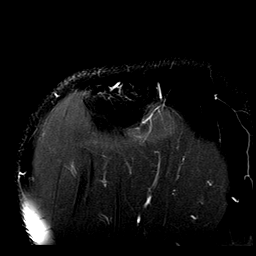

[Series 7: PD fat-sat · oblique · 4.0mm · 0.27mm/px · 7 of 18 slices shown (2 of 2)]
[im 1/18]
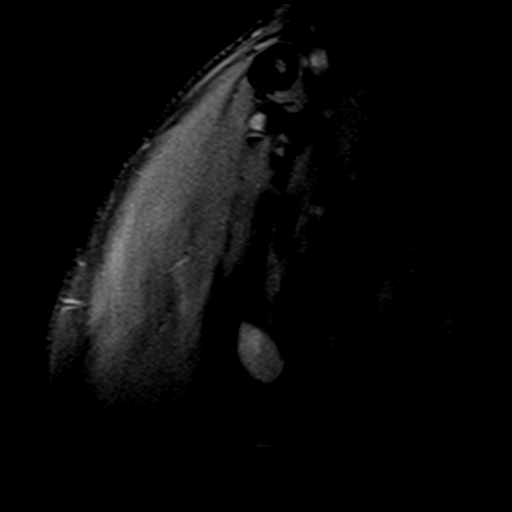
[im 3/18]
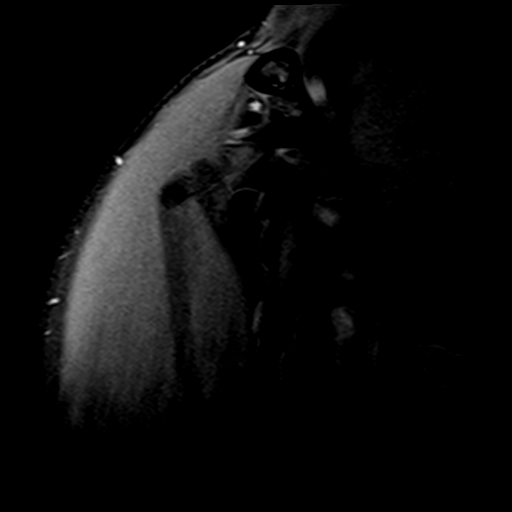
[im 6/18]
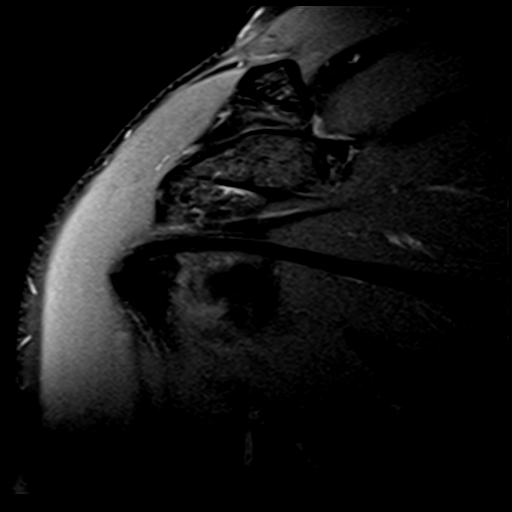
[im 9/18]
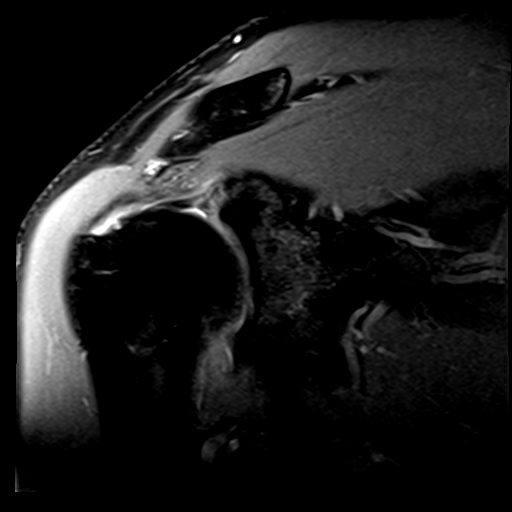
[im 12/18]
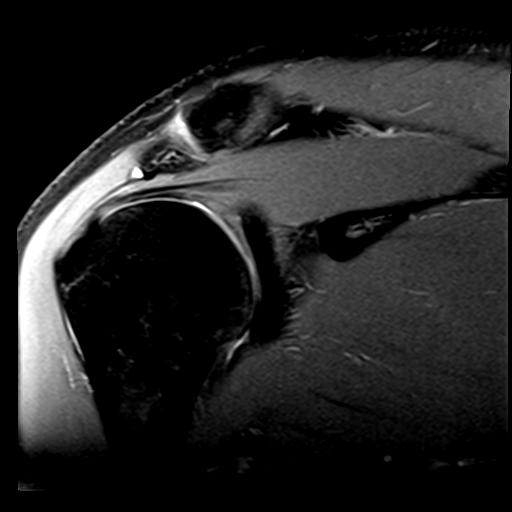
[im 15/18]
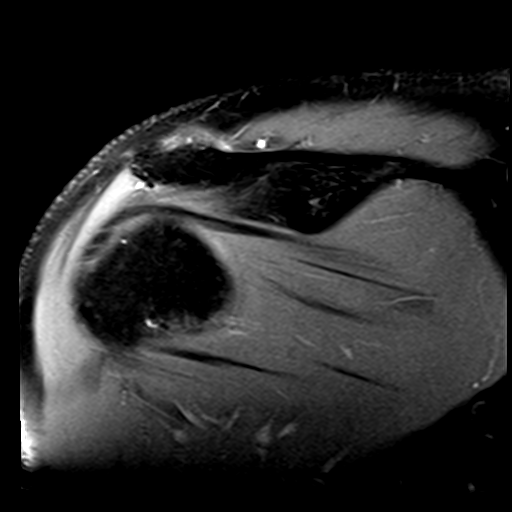
[im 18/18]
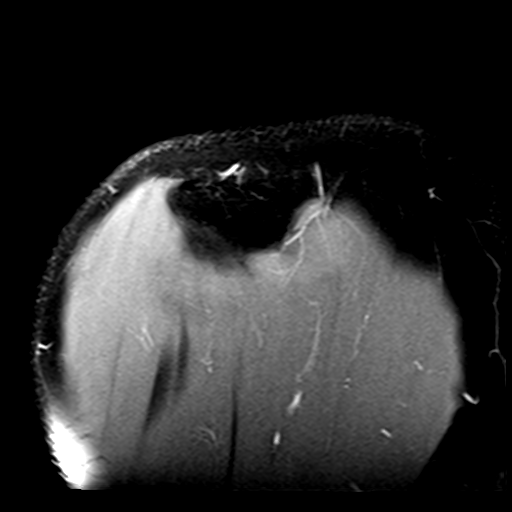

[36 of 40 positions shown; findings below may reference images not displayed]

FINDINGS: Rotator cuff: There is a small focal superficial bursal surface
partial thickness tear of the anterior aspect of the distal
supraspinatus tendon. The rotator cuff otherwise appears normal.

Muscles: No atrophy or abnormal signal of the muscles of the rotator
cuff.

Biceps long head:  Properly located and intact.

Acromioclavicular Joint:  Normal.  Type 1 acromion. No bursitis.

Glenohumeral Joint: No joint effusion. No chondral defect.

Labrum:  Normal.

Bones:  Normal.

Other: None
IMPRESSION: Small focal superficial bursal surface partial thickness tear of the
anterior aspect of the distal supraspinatus tendon. Otherwise,
normal exam.

## 2020-11-10 DIAGNOSIS — F4320 Adjustment disorder, unspecified: Secondary | ICD-10-CM | POA: Diagnosis not present

## 2020-11-23 DIAGNOSIS — F4320 Adjustment disorder, unspecified: Secondary | ICD-10-CM | POA: Diagnosis not present

## 2020-12-07 DIAGNOSIS — F4320 Adjustment disorder, unspecified: Secondary | ICD-10-CM | POA: Diagnosis not present

## 2020-12-20 DIAGNOSIS — Z125 Encounter for screening for malignant neoplasm of prostate: Secondary | ICD-10-CM | POA: Diagnosis not present

## 2020-12-20 DIAGNOSIS — Z0000 Encounter for general adult medical examination without abnormal findings: Secondary | ICD-10-CM | POA: Diagnosis not present

## 2020-12-20 DIAGNOSIS — E559 Vitamin D deficiency, unspecified: Secondary | ICD-10-CM | POA: Diagnosis not present

## 2020-12-20 DIAGNOSIS — E785 Hyperlipidemia, unspecified: Secondary | ICD-10-CM | POA: Diagnosis not present

## 2020-12-27 DIAGNOSIS — F4320 Adjustment disorder, unspecified: Secondary | ICD-10-CM | POA: Diagnosis not present

## 2021-02-09 ENCOUNTER — Other Ambulatory Visit: Payer: Self-pay | Admitting: Internal Medicine

## 2021-02-09 DIAGNOSIS — E785 Hyperlipidemia, unspecified: Secondary | ICD-10-CM

## 2021-02-15 DIAGNOSIS — F4320 Adjustment disorder, unspecified: Secondary | ICD-10-CM | POA: Diagnosis not present

## 2021-02-20 ENCOUNTER — Other Ambulatory Visit: Payer: Self-pay

## 2021-02-20 ENCOUNTER — Ambulatory Visit (HOSPITAL_BASED_OUTPATIENT_CLINIC_OR_DEPARTMENT_OTHER)
Admission: RE | Admit: 2021-02-20 | Discharge: 2021-02-20 | Disposition: A | Discharge status: Home / Self Care | Payer: BC Managed Care – PPO | Source: Ambulatory Visit | Attending: Internal Medicine | Admitting: Internal Medicine

## 2021-02-20 DIAGNOSIS — E785 Hyperlipidemia, unspecified: Secondary | ICD-10-CM

## 2021-03-09 DIAGNOSIS — R945 Abnormal results of liver function studies: Secondary | ICD-10-CM | POA: Diagnosis not present

## 2021-03-28 DIAGNOSIS — M7501 Adhesive capsulitis of right shoulder: Secondary | ICD-10-CM | POA: Diagnosis not present

## 2021-03-29 DIAGNOSIS — F4320 Adjustment disorder, unspecified: Secondary | ICD-10-CM | POA: Diagnosis not present

## 2021-04-27 DIAGNOSIS — M7501 Adhesive capsulitis of right shoulder: Secondary | ICD-10-CM | POA: Diagnosis not present
